# Patient Record
Sex: Male | Born: 1935 | Race: Black or African American | Hispanic: No | Marital: Married | Smoking: Never smoker
Health system: Southern US, Community
[De-identification: ages and names within clinical notes are randomized; demographics above are authoritative.]

## PROBLEM LIST (undated history)

## (undated) ENCOUNTER — Emergency Department (HOSPITAL_BASED_OUTPATIENT_CLINIC_OR_DEPARTMENT_OTHER): Admission: EM | Payer: Medicare Other | Source: Home / Self Care

## (undated) DIAGNOSIS — R55 Syncope and collapse: Secondary | ICD-10-CM

## (undated) DIAGNOSIS — R0609 Other forms of dyspnea: Secondary | ICD-10-CM

## (undated) DIAGNOSIS — M79603 Pain in arm, unspecified: Secondary | ICD-10-CM

## (undated) DIAGNOSIS — N3289 Other specified disorders of bladder: Secondary | ICD-10-CM

## (undated) DIAGNOSIS — I2510 Atherosclerotic heart disease of native coronary artery without angina pectoris: Secondary | ICD-10-CM

## (undated) DIAGNOSIS — K219 Gastro-esophageal reflux disease without esophagitis: Secondary | ICD-10-CM

## (undated) DIAGNOSIS — I10 Essential (primary) hypertension: Secondary | ICD-10-CM

## (undated) DIAGNOSIS — E7800 Pure hypercholesterolemia, unspecified: Secondary | ICD-10-CM

## (undated) DIAGNOSIS — I25810 Atherosclerosis of coronary artery bypass graft(s) without angina pectoris: Secondary | ICD-10-CM

## (undated) DIAGNOSIS — R109 Unspecified abdominal pain: Secondary | ICD-10-CM

## (undated) DIAGNOSIS — R079 Chest pain, unspecified: Secondary | ICD-10-CM

## (undated) DIAGNOSIS — E785 Hyperlipidemia, unspecified: Secondary | ICD-10-CM

## (undated) HISTORY — DX: Pain in arm, unspecified: M79.603

## (undated) HISTORY — DX: Other specified disorders of bladder: N32.89

## (undated) HISTORY — DX: Atherosclerotic heart disease of native coronary artery without angina pectoris: I25.10

## (undated) HISTORY — DX: Essential (primary) hypertension: I10

## (undated) HISTORY — DX: Atherosclerosis of coronary artery bypass graft(s) without angina pectoris: I25.810

## (undated) HISTORY — PX: CORONARY ARTERY BYPASS GRAFT: SHX141

## (undated) HISTORY — DX: Syncope and collapse: R55

## (undated) HISTORY — DX: Chest pain, unspecified: R07.9

## (undated) HISTORY — DX: Hyperlipidemia, unspecified: E78.5

## (undated) HISTORY — PX: OTHER SURGICAL HISTORY: SHX169

## (undated) HISTORY — DX: Other forms of dyspnea: R06.09

## (undated) HISTORY — DX: Unspecified abdominal pain: R10.9

---

## 2004-12-22 ENCOUNTER — Inpatient Hospital Stay (HOSPITAL_COMMUNITY): Admission: EM | Admit: 2004-12-22 | Discharge: 2004-12-24 | Payer: Self-pay | Admitting: Cardiology

## 2004-12-22 ENCOUNTER — Ambulatory Visit: Payer: Self-pay | Admitting: Cardiology

## 2008-06-11 ENCOUNTER — Encounter: Payer: Self-pay | Admitting: Emergency Medicine

## 2008-06-11 ENCOUNTER — Ambulatory Visit: Payer: Self-pay | Admitting: Cardiovascular Disease

## 2008-06-12 ENCOUNTER — Inpatient Hospital Stay (HOSPITAL_COMMUNITY): Admission: EM | Admit: 2008-06-12 | Discharge: 2008-06-22 | Payer: Self-pay | Admitting: Internal Medicine

## 2008-06-13 ENCOUNTER — Encounter: Payer: Self-pay | Admitting: Cardiothoracic Surgery

## 2008-06-13 ENCOUNTER — Ambulatory Visit: Payer: Self-pay | Admitting: Cardiothoracic Surgery

## 2008-06-14 ENCOUNTER — Encounter (INDEPENDENT_AMBULATORY_CARE_PROVIDER_SITE_OTHER): Payer: Self-pay | Admitting: Internal Medicine

## 2008-06-14 ENCOUNTER — Encounter: Payer: Self-pay | Admitting: Cardiothoracic Surgery

## 2008-07-03 ENCOUNTER — Ambulatory Visit: Payer: Self-pay | Admitting: Thoracic Surgery (Cardiothoracic Vascular Surgery)

## 2008-07-10 ENCOUNTER — Ambulatory Visit: Payer: Self-pay | Admitting: Cardiothoracic Surgery

## 2008-07-10 ENCOUNTER — Encounter: Admission: RE | Admit: 2008-07-10 | Discharge: 2008-07-10 | Payer: Self-pay | Admitting: Cardiothoracic Surgery

## 2008-07-19 ENCOUNTER — Encounter: Admission: RE | Admit: 2008-07-19 | Discharge: 2008-07-19 | Payer: Self-pay | Admitting: Cardiothoracic Surgery

## 2008-07-19 ENCOUNTER — Ambulatory Visit: Payer: Self-pay | Admitting: Cardiothoracic Surgery

## 2009-02-22 IMAGING — CR DG CHEST 2V
2 series · 2 of 2 positions shown · non-contrast
Comparison: 06/20/2008

CLINICAL DATA: Chest pain.  CABG.

CHEST - 2 VIEW

[view not recorded (1 of 2)]
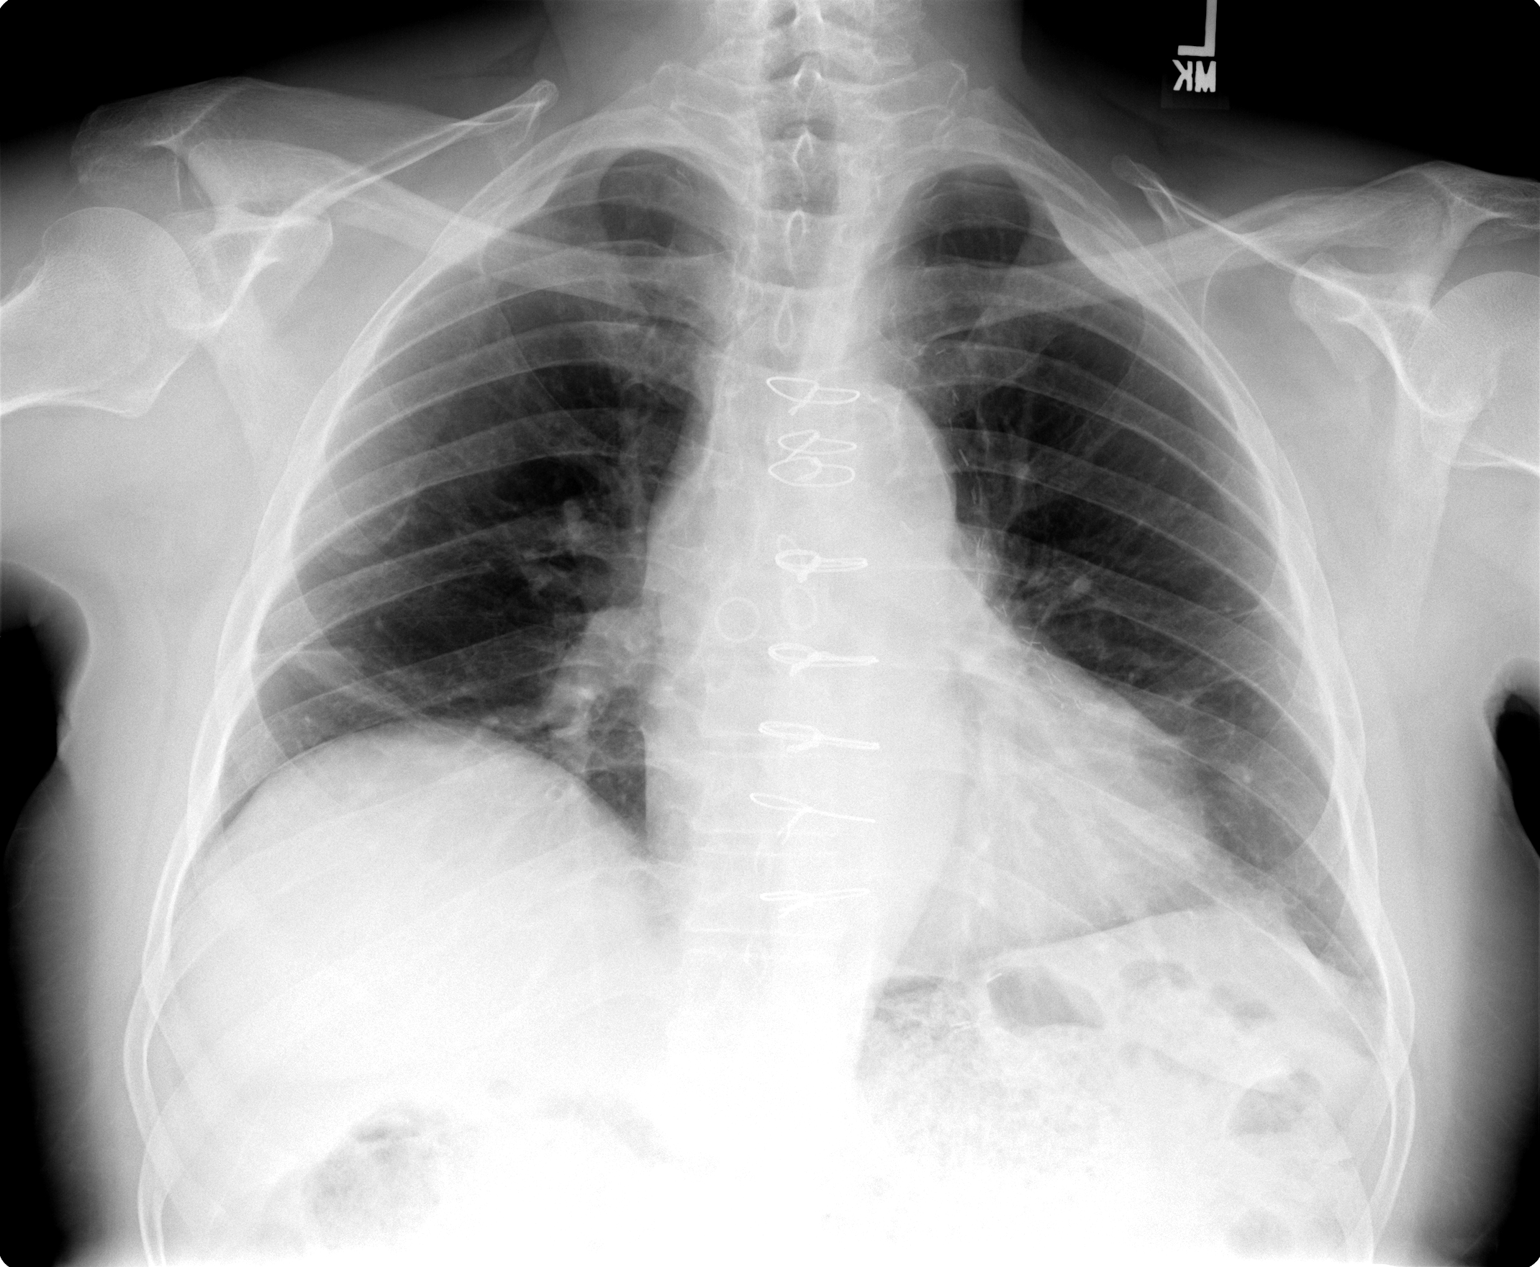

[view not recorded (2 of 2)]
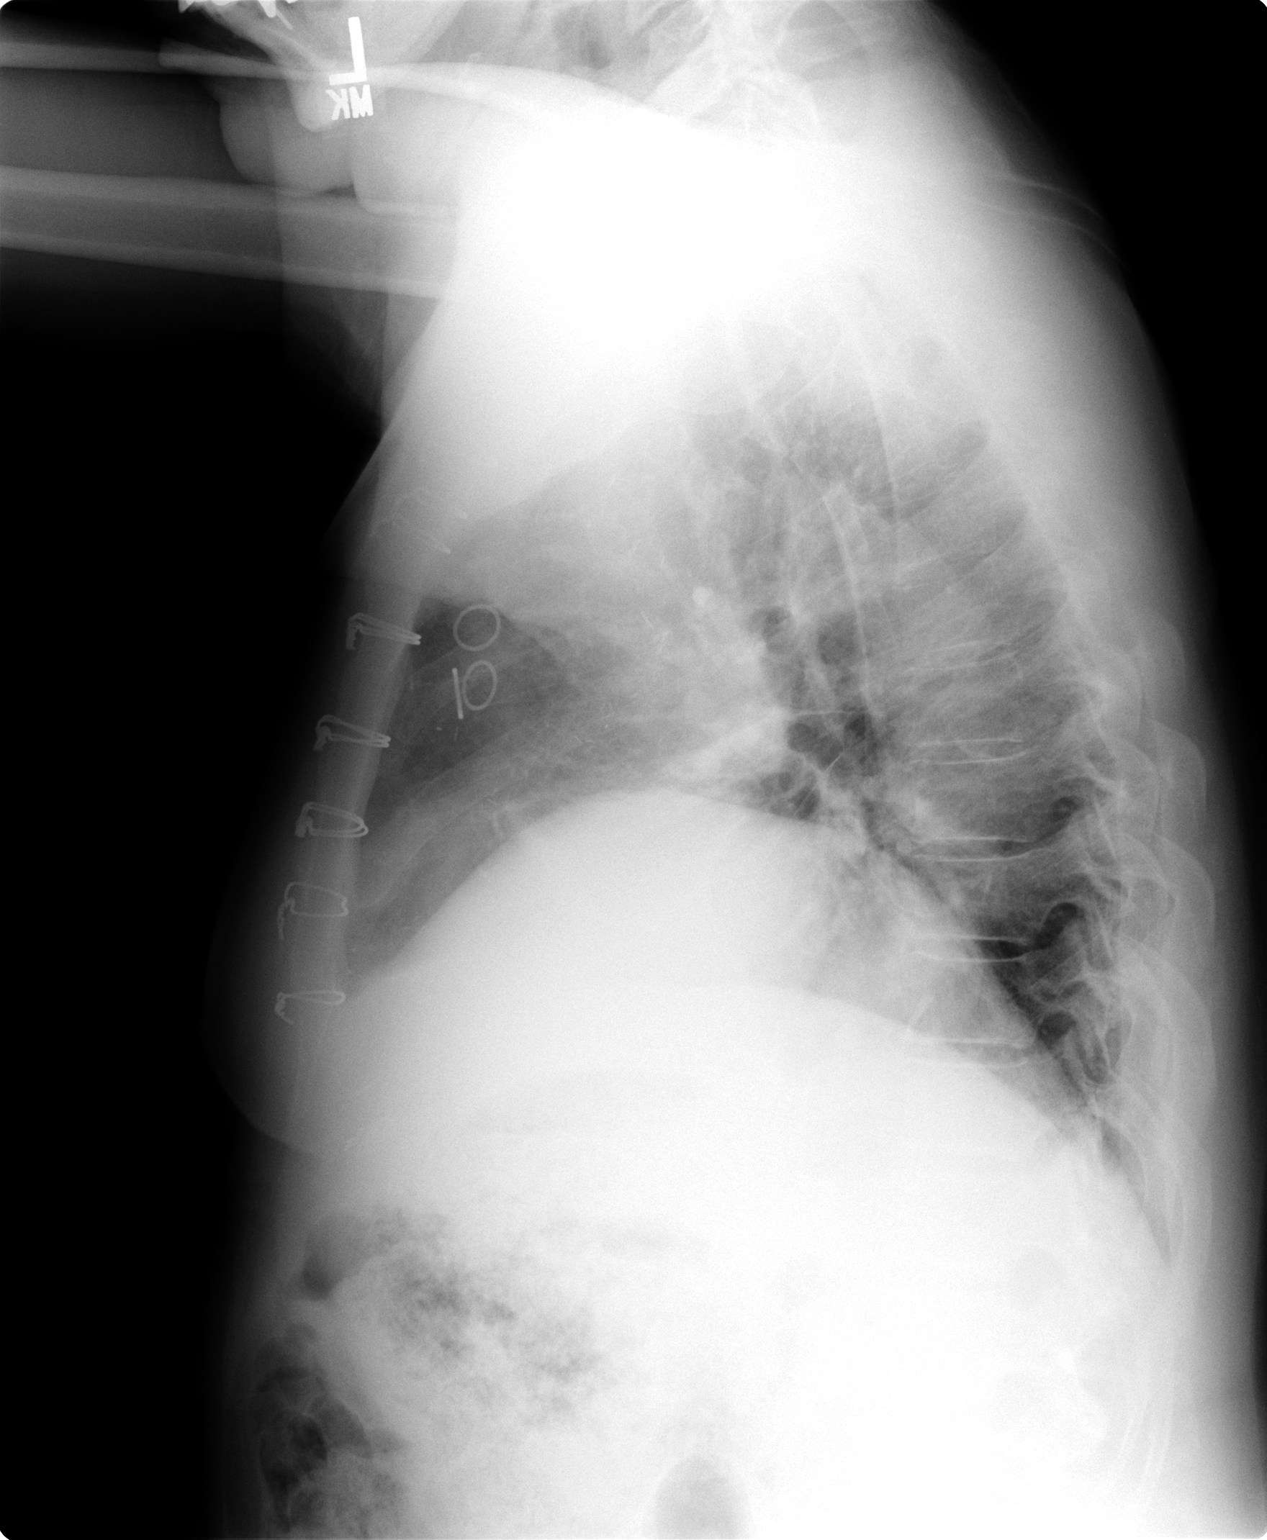

[2 of 2 positions shown; findings below may reference images not displayed]

FINDINGS: Subsegmental atelectasis at the bases, improved since
prior exam.  No airspace opacities.  Cardiomediastinal silhouette
appears unremarkable.  CABG changes..
IMPRESSION: Improvement in subsegmental atelectasis at the bases.

## 2010-10-20 ENCOUNTER — Emergency Department (HOSPITAL_BASED_OUTPATIENT_CLINIC_OR_DEPARTMENT_OTHER)
Admission: EM | Admit: 2010-10-20 | Discharge: 2010-10-20 | Payer: Self-pay | Source: Home / Self Care | Admitting: Emergency Medicine

## 2011-04-13 NOTE — Op Note (Signed)
NAME:  Sean Kelley, DADY NO.:  [id number]   MEDICAL RECORD NO.:  [id number]          PATIENT TYPE:  INP   LOCATION:  2308                         FACILITY:  [facility]   PHYSICIAN:  [name], M.D.  DATE OF BIRTH:  1930/02/08   DATE OF PROCEDURE:  06/17/2008  DATE OF DISCHARGE:                               OPERATIVE REPORT   OPERATION:  1. Coronary artery bypass grafting x5 (left internal mammary artery to      left anterior descending, saphenous vein graft to diagonal,      saphenous vein graft to posterior descending, sequential saphenous      vein graft to obtuse marginal 1 and obtuse marginal 2).  2. Endoscopic harvest of the right leg greater saphenous vein.   SURGEON:  [name], MD   ASSISTANT:  1. Evelene Croon, MD  2. Stephanie Acre. Dominick, PA-C   ANESTHESIA:  General.   POSTOPERATIVE DIAGNOSIS:  Class IV unstable angina with severe left main  and three-vessel coronary artery disease.   INDICATIONS:  The patient is a 77 year old gentleman who presented with  progressive chest pain, increasing in intensity and a with decreasing  threshold of onset with any activity.  He was ruled out for MI and was  cath'd by Dr. Katrinka Blazing, which showed a 70% left main stenosis and severe  critical three-vessel coronary artery disease and a left-dominant  circulation.  His LV function was preserved and he was felt to be a  candidate for surgical revascularization.  I examined the patient in his  hospital room and reviewed the results of the cardiac cath with the  patient and family.  I discussed indications and expected benefits of  coronary artery bypass surgery for treatment of his coronary artery  disease.  I reviewed the alternatives to surgical therapy as well.  I  discussed with him the major issues of surgery including the location of  the surgical incisions, the use of general anesthesia and  cardiopulmonary bypass, the expected postoperative hospital  recovery and  the potential risks of surgery including MI, CVA, bleeding, blood  transfusion requirement, infection, and death.  He understood these  implications for the surgery and agreed to proceed under what I felt was  an informed consent.   OPERATIVE FINDINGS:  The vein was harvested from the right leg and was  of good quality.  The mammary artery had excellent flow.  No blood  products were required for the surgery.  The posterior ascending branch  of the distal circumflex was a small 1-mm vessel, but graftable.  There  is no area of myocardial fibrosis or scarring.  The ascending aorta  measured 3.8 to 4.0-cm in diameter.  There is mild AI by TEE.   PROCEDURE:  The patient was brought to the operative room and placed  supine on the operating table.  General anesthesia was induced.  The  chest, abdomen, and legs were prepped with Betadine and draped as a  sterile field.  A sternal incision was made as the saphenous vein was  harvested endoscopically from the right leg.  The left internal mammary  artery was harvested as a pedicle graft from its origin at the  subclavian vessels.  It was a good vessel with excellent flow.  The  sternal retractor was placed and the pericardium was opened and  suspended.  Heparin was administered and the ACT was documented as being  therapeutic.  Pursestrings were placed in the ascending aorta in right  atrium and the patient was cannulated and placed on bypass.  The  coronaries were identified for grafting in the mammary artery and vein  grafts were prepared for the distal anastomoses.  Cardioplegia catheters  were placed for both antegrade and retrograde cold blood cardioplegia.  The patient was then cooled to 32 degrees and aortic crossclamp was  applied.  A 900 mL of cold blood cardioplegia was delivered in split  doses between the antegrade aortic and retrograde coronary sinus  catheters.  There was good cardioplegic arrest and septal  temperature  dropped less than 14 degrees.  Cardioplegia was then redosed every 20  minutes or less while the crossclamp was then placed.   The distal coronary anastomoses were performed.  The first distal  anastomosis was to the posterior branch of the distal circumflex, which  was the dominant vessel.  There was a 1.0-mm vessel and had a proximal  80% stenosis.  A reverse saphenous vein was sewn end-to-side with  running 7-0 Prolene and was good flow through the graft.  The second  distal anastomosis was to the diagonal branch to LAD.  This was a 1.5-mm  vessel with a proximal 80% ostial stenosis.  A reverse saphenous vein  was sewn end-to-side with running 7-0 Prolene with good flow through the  graft.  The third and fourth distal anastomoses consisted of a  sequential vein graft to the OM1 and the OM2.  The OM1 had a left main  proximal stenosis and was a 1.5-mm vessel.  A side-to-side anastomosis  with a saphenous vein was constructed using running 7-0 Prolene.  There  was a good flow through the graft.  The fourth distal anastomosis was  the continuation of the sequential vein graft to the OM2.  This had a 80-  90% stenosis in its proximal segment and the vein was sewn end-to-side  with running 7-0 Prolene with good flow through the graft.  Cardioplegia  was then redosed through the vein grafts.  The fifth distal anastomosis  was then placed to the mid-LAD.  There was a 1.7-mm vessel proximal left  main stenosis.  The left IMA pedicle was brought through an opening  created in the left lateral pericardium and was brought down onto the  LAD and sewn end-to-side with a running 8-0 Prolene.  A good flow  through the anastomosis after briefly releasing the pedicle bulldog on  the mammary artery.  The bulldog was reapplied and the pedicle was  secured to the epicardium.  Cardioplegia was then redosed.   While the crossclamp was still in place, three proximal vein anastomoses  were  placed on the ascending aorta using a 4.0-mm punch running 7-0  Prolene.  Prior to tying down the final proximal anastomosis, air was  vented from the coronaries with a dose of retrograde warm blood  cardioplegia in the usual de-airing maneuvers on bypass.  The final  proximal anastomosis was tied and the crossclamp was removed.   The heart was cardioverted back to a regular rhythm.  Air was aspirated  from the vein grafts with a  27-gauge needle.  The proximal distal  anastomoses were checked and found to be hemostatic.  Each graft had  good flow.  The patient was rewarmed to 37 degrees and temporary pacing  wires were applied.  When the patient was rewarmed and reperfused, the  lungs re-expanded, then the ventilator was resumed.  The patient was  then weaned from bypass without difficulty, without requiring inotropes.  Cardiac output and blood pressures were within normal limits and  protamine was administered without adverse reaction.  The cannulas were  all removed and mediastinum was irrigated with warm antibiotic  irrigation.  The leg incision was irrigated and closed in a standard  fashion.  The superior pericardial valve was closed over the aorta and  vein grafts.  Two mediastinal and a left pleural chest tube were placed  and brought out through separate incisions.  The sternum was closed with  interrupted steel wire.  The pectoralis fascia was closed with a running  #1 Vicryl.  The subcutaneous and skin layers were closed in running  Vicryl and sterile dressings were applied.  Total bypass time was 132  minutes with crossclamp time of 98 minutes.      [name], M.D.  Electronically Signed    PV/MEDQ  D:  06/17/2008  T:  06/18/2008  Job:  [id number]   cc:   Lyn Records, M.D.

## 2011-04-13 NOTE — Assessment & Plan Note (Signed)
OFFICE VISIT   Sean Kelley, Sean Kelley  DOB:  1930-07-07                                        July 19, 2008  CHART #:  [id number]   HISTORY OF PRESENT ILLNESS:  This is a 77 year old African American  male, who is status post coronary artery bypass grafting x5 on June 17, 2008, by Dr. Donata Kelley.  The patient had previously been seen in the  office regarding low grade (high 99 to a fever of 100.3) after having  been discharged from the hospital.  He also had noticed some redness and  swelling along the endovascular vein harvesting site of the right lower  extremity.  Dr. Dorris Kelley had seen him in the office on July 03, 2008, and placed him on Keflex.  However, approximately 2 days after  taking Keflex, the patient developed an urticarial rash.  The Keflex was  stopped and Avelox was started.  He was last seen in the office on  July 10, 2008.  At that time, he had no further fever, chills, sweats,  and he reported that the swelling had decreased since the last office  visit.  Currently on this office visit, the patient states he has  finished his Avelox.  He denies any fevers or chills.  He also states  that the swelling and redness has decreased since his last office visit.  The patient's only complaint is occasional shortness of breath upon  exertion.   PHYSICAL EXAMINATION:  GENERAL:  This is a pleasant 77 year old [place]  American male, who is in no acute distress.  VITAL SIGNS:  BP is 134/79, pulse rate 87, respirations 18, O2 sat 98%  on room air.  CARDIOVASCULAR:  Regular rate and rhythm.  S1 and S2 without murmurs,  gallops, or rubs.  PULMONARY:  Clear to auscultation bilaterally, slightly decreased at  left base.  EXTREMITIES:  Trace edema at the right lower extremity.  EVH site at the  right lower extremity, minimal swelling.  EVH site at left lower  extremity is clean, dry, no swelling or erythema.  STERNUM:  Solid.  No crepitus noted.   Wound is clean and dry.   Chest x-ray today showed some atelectasis at right lower lobe.  No  pleural effusions.  No pneumothorax.   IMPRESSION AND PLAN:  The patient will see Dr. Donata Kelley on a p.r.n.  basis.  He will continue to be followed by Dr. Katrinka Kelley.  It should be  noted that the patient was recently taken off metoprolol.  He still  continues to take simvastatin, ECASA, however.  The patient will call  for any further problems, questions, or concerns in the interim.   Sean Kelley, M.D.  Electronically Signed   DZ/MEDQ  D:  07/19/2008  T:  07/19/2008  Job:  [id number]   cc:   Sean Kelley, M.D.

## 2011-04-13 NOTE — Discharge Summary (Signed)
NAME:  Sean Kelley, Sean Kelley NO.:  [id number]   MEDICAL RECORD NO.:  [id number]          PATIENT TYPE:  INP   LOCATION:  2019                         FACILITY:  [facility]   PHYSICIAN:  [name], M.D.  DATE OF BIRTH:  1930/09/21   DATE OF ADMISSION:  06/11/2008  DATE OF DISCHARGE:  06/22/2008                               DISCHARGE SUMMARY   FINAL DIAGNOSIS:  Class IV unstable angina with severe left main and 3-  vessel coronary artery disease.   IN-HOSPITAL DIAGNOSES:  1. Volume overload postoperatively.  2. Acute blood loss anemia postoperatively.  3. Borderline diabetes mellitus.   SECONDARY DIAGNOSES:  1. Hypertension.  2. History of peptic ulcer disease status post vagotomy and antrectomy      at [facility] over 30 years ago.  3. Dyslipidemia.   IN-HOSPITAL OPERATIONS AND PROCEDURES:  1. Cardiac catheterization.  2. Coronary artery bypass grafting x5 using a left internal mammary      artery to left anterior descending, saphenous vein graft to      diagonal, saphenous vein graft to posterior descending, and      sequential saphenous vein graft to obtuse marginal 1 and 2.      Endoscopic saphenous vein harvesting from right leg.   HISTORY AND PHYSICAL AND HOSPITAL COURSE:  The patient is a 77 year old  gentleman who presented with progressive chest pain, increasing in  intensity with a decreasing threshold of onset with any activity.  He is  ruled out for an MI and underwent cardiac catheterization by Dr. Katrinka Blazing.  This shows severe left main and 3-vessel coronary artery disease.  Dr.  Donata Clay was consulted.  Dr. Donata Clay saw and evaluated the patient.  He discussed with the patient regarding coronary artery bypass grafting.  He discussed the risks and benefits with the patient.  The patient  acknowledged this understanding and agreed to proceed.  Surgery was  scheduled for June 17, 2008.  Preoperatively, the patient underwent  bilateral carotid duplex  ultrasound showing no evidence of significant  ICA stenosis.  He also had preoperative ABIs done showing on the right  to be greater than 0.95 and on the left to be 0.98.  The patient  remained stable preoperatively.  For details of the patient's past  medical history and physical exam, please see dictated H&P.   The patient was taken to the operating room on June 17, 2008, where he  underwent coronary artery bypass grafting x5 using a left internal  mammary artery to left anterior descending, saphenous vein graft to  diagonal, saphenous vein graft to posterior descending, and sequential  saphenous vein graft to obtuse marginal 1 and 2.  The patient had  endoscopic vein harvesting from the right leg done.  The patient  tolerated this procedure well and transferred to the intensive care unit  in stable condition.  Postoperatively, the patient was known to be  hemodynamically stable.  He was extubated evening of surgery.  Postextubation, he was noted to be alert and oriented x4 and neuro  intact.  The patient's postoperative course is  premature and remarkable.  All lines and drips were discontinued in normal fashion.  Postoperative  chest x-ray was stable and he had minimum change from chest tubes and  these were able to be discontinued in the normal fashion.  The patient  did have some mild volume overload and was started on diuretics.  He  also had some mild blood loss anemia with hemoglobin 8.4 and started on  p.o. iron.  No transfusions were required.  The patient transferred out  to 2000 on postop day #2.  The patient continued to progress well.  From  a pulmonary standpoint, he was using his incentive spirometer.  He was  able to be weaned off oxygen sating greater than 90% on room air.  The  patient remained in normal sinus rhythm postoperatively.  He was able to  be started on low-dose beta-blocker.  Heart rate and blood pressure  tolerated well.  His hemoglobin and hematocrit were  monitored and  remained stable.  He was continued on p.o. iron.  The patient was out of  bed, ambulating well with cardiac rehab.  He was tolerating diet well  and no nausea and vomiting noted.  All incisions were clean, dry, and  intact and healing well.  The patient had a hemoglobin A1c done  preoperatively showing to be slightly elevated at 6.5.  The patient has  no history of diabetes.  He had been started on Lantus and sliding scale  insulin.  The plan is for this to be discontinued prior to discharge.   On postop day #4, the patient was known to be afebrile and normal sinus  rhythm in the 80s.  O2 sats 94% on room air.  He still remains 4.7 kg  above his preoperative weight.  Blood sugars were stable.  CBC done  showed a white count 6.2, hemoglobin is 8.4, hematocrit  24.5, platelet  count of 151.  BUN and creatinine day prior was 14 and 0.87.   The patient was tentatively ready for discharge home in the a.m. postop  day #5, June 22, 2008.   FOLLOWUP APPOINTMENTS:  A followup appointment has been arranged with  Dr. Donata Clay for July 19, 2008, at 12:30 p.m.  The patient will need  to obtain PA and lateral chest x-ray 30 minutes prior to this  appointment.  He will need to contact Dr. Michaelle Copas office to make a  followup appointment with him in 2 weeks.   ACTIVITY:  The patient was instructed no driving until he agrees to do  so.  No heavy lifting over 10 pounds.  He was told to ambulate 3-4 times  per day, progress as tolerated, and to continue his breathing exercises.   INCISIONAL CARE:  The patient was told to shower and washing his  incisions using soap and water.  He is to contact the office if he  develops any drainage or opening from any of his incision sites.   DIET:  The patient was educated on diet, to be on low-fat, low-salt as  well as carbohydrate modify medium calorie diet.   DISCHARGE MEDICATIONS:  1. Aspirin 81 mg daily.  2. Lopressor 25 mg b.i.d.  3. Zocor  20 mg at night.  4. Lasix 40 mg daily x5 days.  5. Potassium chloride 20  mEq daily x5 days.  6. Niferex 150 mg daily.  7. Oxycodone 5 mg 1-2 tablets every 4-6 hours p.r.n.      Theda Belfast, PA  [name], M.D.  Electronically Signed    KMD/MEDQ  D:  06/21/2008  T:  06/22/2008  Job:  [id number]   cc:   [name], M.D.  Lyn Records, M.D.

## 2011-04-13 NOTE — Consult Note (Signed)
NAME:  Sean Kelley, Sean Kelley NO.:  [id number]   MEDICAL RECORD NO.:  [id number]          PATIENT TYPE:  INP   LOCATION:  3709                         FACILITY:  [facility]   PHYSICIAN:  [name], M.D.  DATE OF BIRTH:  10/23/1930   DATE OF CONSULTATION:  06/13/2008  DATE OF DISCHARGE:                                 CONSULTATION   REQUESTING PHYSICIAN:  Lyn Records, M.D.   PRIMARY CARE PHYSICIAN:  Fleet Contras, MD   CONSULTANT:  [name], MD   REASON FOR CONSULTATION:  Left main and severe three-vessel coronary  artery disease.   CHIEF COMPLAINT:  Chest pain with class III progressive angina.   HISTORY OF PRESENT ILLNESS:  I was asked to evaluate this 77 year old  African American male who was admitted to the hospital following cardiac  catheterization for symptoms of exertional class III angina.  A previous  cath performed 2-3 years ago showed nonsignificant coronary disease.  He  was admitted, where his cardiac enzymes were checked and were negative.  Diagnostic cath today by Dr. Katrinka Blazing demonstrated a 50-60% tubular  stenosis of the left main.  By IVUS, there is a 70% reduction in the  luminal area.  The first diagonal had a 70% stenosis, the OM3 had a 90%  stenosis and the patient had a left dominant coronary circulation.  LV  EF was normal and there was no evidence of valvular insufficiency.  Based on the patients' coronary anatomy and symptoms, he is felt to be a  candidate for surgical coronary revascularization.  He is currently  stable on subcutaneous Lovenox therapeutic dosing.   PAST MEDICAL HISTORY:  1. Hypertension.  2. History of peptic ulcer disease status post vagotomy and antrectomy      at [facility] over 30 years ago.  3. No known drug allergies.  4. Dyslipidemia.  5. Left-hand dominance.   HOME MEDICATIONS:  None.   ALLERGIES:  None.   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   He does not smoke or use alcohol.Marland Kitchen   FAMILY HISTORY:  His parents died of heart disease.  No one in the  family has had heart bypass surgery.  The patient is married and lives  in [place].   REVIEW OF SYSTEMS:  GENERAL:  Negative for fever and weight loss.  ENT:  Negative for active dental problems.  He does have an upper bridge.  Denies difficulty swallowing.  THORACIC:  Negative for history of chest  trauma.  A chest x-ray taken 48 hours ago shows no significant  cardiopulmonary disease.  CARDIAC:  Positive for his severe coronary  disease, left main stenosis and unstable angina as noted above.  ABDOMEN:  Positive for his previous laparotomy and ulcer disease but  negative for hepatitis, jaundice, or blood per rectum recently.  UROLOGIC:  Negative for prostate cancer, BPH, or kidney stones or UTI.  ENDOCRINE:  Negative for diabetes or thyroid disease.  VASCULAR:  Negative for DVT, claudication, or TIA.  NEUROLOGIC:  Negative for  stroke or seizure.   PHYSICAL EXAMINATION:  VITALS SIGNS:  The patient is 5 feet 8 inches and  weighs 170 pounds, blood pressure 145/88, pulse 60, respirations 18.  GENERAL:  Pleasant, well-built, elderly black male in no acute distress,  surrounded by family.  HEENT:  Normocephalic.  Dentition good.  EOMs are full.  NECK:  Without JVD, mass, or carotid bruit.  LYMPHATICS:  No palpable supraclavicular or cervical adenopathy.  Breath  sounds are clear and there is no thoracic deformity.  CARDIAC:  Regular  rhythm with a soft 1/6 systolic murmur at the left lower sternal border.  There is no friction rub.  ABDOMEN:  Soft without pulsatile mass.  Bowel sounds are present.  EXTREMITIES:  No clubbing, cyanosis, or edema.  VASCULAR:  Pulses 1-2+ in all extremities.  SKIN:  Without rash or lesion.  NEUROLOGIC:  Alert and oriented x3 without focal motor deficit.   LABORATORY DATA:  I reviewed his coronary arteriograms and coronary CT  scans.  He has a  significant left main stenosis with left-dominant  circulation and high-grade stenosis of the OM3 branch to the circumflex.  His vessels are adequate targets for bypass surgery.   RECOMMENDATIONS:  The patient would be best treated with multivessel  bypass surgery for his severe left main multivessel disease.  We will  schedule his surgery as soon as possible and we recommend keeping the  patient in the hospital on heparin until surgery.  His preoperative  studies to follow include a 2-D echocardiogram and carotid Dopplers and  peripheral vascular studies (noninvasive).  Thank you for the  consultation.      [name], M.D.  Electronically Signed     PV/MEDQ  D:  06/13/2008  T:  06/14/2008  Job:  [id number]

## 2011-04-13 NOTE — H&P (Signed)
NAME:  Sean Kelley, Sean Kelley NO.:  [id number]   MEDICAL RECORD NO.:  [id number]          PATIENT TYPE:  INP   LOCATION:  3709                         FACILITY:  [facility]   PHYSICIAN:  Hollice Espy, M.D.DATE OF BIRTH:  January 22, 1930   DATE OF ADMISSION:  06/11/2008  DATE OF DISCHARGE:                              HISTORY & PHYSICAL   ATTENDING PHYSICIAN:  The patient has no PCP, but is scheduled to see a  Dr. Selinda Eon in a few months.   CARDIOLOGIST:  Lyn Records, M.D.   CHIEF COMPLAINT:  Chest pain.   HISTORY OF PRESENT ILLNESS:  The patient is a 77 year old African  American male with no past medical history, who a month ago started  having episodes of heavy pressure.  He previously had seen Dr. Katrinka Blazing and  had a cardiac cath done several years back.  At that time he was told he  had 1 vessel that was diseased, but there was no need for intervention  at that time.  Again, he has been having episodes of chest pressure  about a month ago.  It resolved, so we could not follow up on it.  He  since that time has had a couple of episodes, but nothing as severe as  the first time.  This is located over his left breast.  Then 1 day prior  to coming in, he started having episodes of a separate type of pain.  This is more associated with a sharp pins and needles and feeling, more  over the right breast, but also had felt very lightheaded.  ain in the  needle feeling more over the right breast but also had felt very  lightheaded.  He initially tried to dismiss it, but after his family  found out today, they spoke to Dr. Michaelle Copas office who advised the  patient to go to the emergency room.  The patient went to [facility], where after evaluation of a negative EKG and initial  set of enzymes, it was felt best to come in for further evaluation and  possible treatment.  The patient was arranged as a direct admission to  [facility]  Initially [facility]  hospitalists were consulted  because the patient mentioned [facility] cardiology, but he actually was  found to have no PCP.  After the patient was accepted, I have spoken  with the [facility] hospitalist and will admit the patient and the  patient will be picked up by [facility], Team B following.  Currently the  patient is doing well.  He currently denies any headaches, vision  changes, dysphagia, chest pain, palpitations, shortness of breath,  wheeze, cough, abdominal pain, hematuria, dysuria, constipation,  diarrhea, focal extremity numbness, weakness or pain.   REVIEW OF SYSTEMS:  Otherwise negative.   PAST MEDICAL HISTORY:  None.  His prestated cardiac cath was found to  have 1 vessel of disease.  This was done in January 2006.  Specifically  he is found to have a nonobstructive CAD with distal left main plaque  extending into ostial left anterior descending,  not flow limiting   MEDICATIONS:  None.   ALLERGIES:  None.   SOCIAL HISTORY:  Denied tobacco, alcohol or drug use.   FAMILY HISTORY:  Noted for dad, who died of an MI at about the same age  as the patient.   PHYSICAL EXAMINATION:  The patient's vitals on admission to the floor,  temperature 97.8, heart rate 62, blood pressure 154/82, respirations 18,  O2 sat 98% on room air.  GENERAL:  He is alert and oriented x3, in no  apparent distress.  HEENT:  Normocephalic atraumatic.  Mucous membranes are moist.  He has  no carotid bruits.  HEART:  Regular rate and rhythm.  S1 and S2.  2/6 systolic ejection  murmur.  LUNGS:  Clear to auscultation bilaterally.  ABDOMEN:  Soft, nontender, nondistended.  Positive bowel sounds.  EXTREMITIES:  Showed no clubbing, cyanosis or edema.   LAB WORK:  CPK 56.1, MB 1.22, troponin 0.05.  Sodium 139, potassium 4.2,  chloride 100, bicarb 29, BUN 14, creatinine 0.9, glucose 98.  White  count 0.1, H and H 14.6 and 42, MCV of 97, platelet count 191, 34%  neutrophils.   ASSESSMENT AND PLAN:  1.  Atypical chest pain pressure 1 month ago, now more of a sharp pins      and needles sensation in a different location.  Check a D-dimer.      Enzymes x3.  First set is already negative.  [facility] to      see in the morning.  Will check also check a fasting lipid profile.  2. Elevated blood pressure here.  The patient has some previous      history of reported blood pressure.  However, he will be following      with the primary care physician to establish in several months, who      can follow him, and if needed, start him on medication.      Hollice Espy, M.D.  Electronically Signed     SKK/MEDQ  D:  06/11/2008  T:  06/11/2008  Job:  [id number]   cc:   Dr. Signe Colt, M.D.

## 2011-04-13 NOTE — Assessment & Plan Note (Signed)
OFFICE VISIT   Sean Kelley, Sean Kelley  DOB:  10/13/1930                                        July 03, 2008  CHART #:  [id number]   The patient is a 78 year old gentleman who had coronary bypass grafting  x5 on June 17, 2008, by Dr. Donata Clay.  Over the past few days, he has  noted low-grade fevers usually in the high 99s, but yesterday when  checked, it was 100.3, he was taking Tylenol.  Also, noted some redness  and swelling along his saphenous venectomy harvest tract in the right  leg.  He has had some minimal pain in his sternal incision, but no  redness there and has not had to take any pain medication in the past 5  or 6 days.   PHYSICAL EXAMINATION:  GENERAL:  The patient is a 77 year old gentleman,  in no acute distress.  VITAL SIGNS:  Blood pressure 144/76, pulse 85, respirations are 18, and  his oxygen saturation is 96% on room air.  CHEST:  Sternal incisions are clean, dry, and intact.  Sternum is  stable.  EXTREMITIES:  His leg incisions are healing well.  He does have fluid  along the saphenous venectomy tract about mid thigh to mid calf.  There  is also some mild erythema, mild warmth, and mild tenderness.   IMPRESSION:  The patient is a 77 year old gentleman status post coronary  artery bypass grafting about 2 weeks ago.  He now has some fluid more  likely a seroma along the saphenous venectomy tract, but he does have  some overlying early cellulitis with some low-grade temperatures.  I am  now starting him on Keflex 500 mg p.o. 3 times daily for a week and  advised him to take all of those capsules and complete the course even  if the warmth and tenderness  improves.  We will plan to have him see Dr. Donata Clay back in the office  next Friday, which will be July 12, 2008.  We will do a chest x-ray at  that time.   Salvatore Decent Dorris Fetch, M.D.  Electronically Signed   SCH/MEDQ  D:  07/03/2008  T:  07/03/2008  Job:  [id number]

## 2011-04-13 NOTE — Assessment & Plan Note (Signed)
OFFICE VISIT   TIRAS, BIANCHINI  DOB:  09/19/1930                                        July 19, 2008  CHART #:  [id number]   ADDENDUM   PHYSICAL EXAMINATION:  EXTREMITIES:  The patient had endoscopic vein  harvest of the right lower extremity only.  There was no EVH site at the  left lower extremity.  Therefore, there was no wound.   [name], M.D.  Electronically Signed   DZ/MEDQ  D:  07/19/2008  T:  07/20/2008  Job:  [id number]

## 2011-04-13 NOTE — Consult Note (Signed)
NAME:  Sean Kelley, Sean Kelley NO.:  [id number]   MEDICAL RECORD NO.:  [id number]          PATIENT TYPE:  INP   LOCATION:  3709                         FACILITY:  [facility]   PHYSICIAN:  Lyn Records, M.D.   DATE OF BIRTH:  1930-02-26   DATE OF CONSULTATION:  DATE OF DISCHARGE:                                 CONSULTATION   REASON FOR CONSULTATION:  Chest discomfort.   CONCLUSIONS:  1. Atypical chest discomfort of two types.  The patient has had a      fluttering or pounding type discomfort that occurred yesterday      predominantly on the right chest.  2. One-month history of intermittent tightness in the chest.  3. History of prior cardiac catheterization with less than 30%      narrowing of the distal left main.  4. Hyperlipidemia.   RECOMMENDATIONS:  Coronary CT angiography, to rule out significant  obstructive disease.  This study is being done in lieu of the stress  test as we were not notified of the patient's presence until early this  afternoon and it was not possible to do a stress test on the patient  until the a.m.  If we do the CTA and the patient's coronary is widely  patent, he would likely be discharged from the hospital this evening.   COMMENTS:  The patient is 76 years of age.  He has undergone previous  cardiac workup including catheterization in 2006 by Dr. Diona Browner.  He  was found to have less than 30% narrowing in the distal left main with  widely patent coronaries otherwise.  Over the past month, he has had  some recurring episodes of discomfort that he described as a heaviness  in his chest.  These episodes tend to occur spontaneously.  Yesterday,  he began having some fluttering in his right chest.  He had been  concerned about this and also noted that he will break out into a sweat  when this would occur.  The patient was seen at the ED in [facility].  From there, he was transferred to [facility].  He states today  that his chest is  felt generally heavy, but he has not told anyone about  it.  He otherwise feels fine.   MEDICATIONS ON ADMISSION:  None.   ALLERGIES:  None.   HABITS:  Does not smoke and does not drink.   FAMILY HISTORY:  His mother and father died of myocardial infarction in  their 67s.   SIGNIFICANT MEDICAL PROBLEMS:  1. Hyperlipidemia.  2. Gastroesophageal reflux.   PHYSICAL EXAMINATION:  GENERAL:  The patient is in no acute distress.  VITAL SIGNS:  Blood pressure 124/70, heart rate 60.  NECK:  Neck veins not distended.  No carotid bruits.  LUNGS:  Clear.  CARDIAC:  1/6 systolic murmur.  No diastolic murmur.  ABDOMEN:  Soft.  EXTREMITIES:  No edema.  Pulses 2+ and symmetric in upper and lower  extremities.   Laboratory data reveals creatinine of 0.9, BUN 14.  CK-MB and troponin  negative x2.  LDL  cholesterol 118.  EKG:  Normal sinus rhythm and  basically a normal tracing with slight right axis deviation.   DISCUSSION:  The patient's symptoms are multiform.  The acute discomfort  that prompted hospitalization is probably an arrhythmia or some form of  noncardiac chest discomfort.  I am somewhat concerned about recurring  chest tightness that has been happening for the past month.  He did have  minimal obstructive disease in the left main 3 years ago.  Coronary CTA  will be done to ensure patency of the left main and that there is no  significant proximal obstructive disease since his coronaries were  normal otherwise when evaluated 3 years ago.      Lyn Records, M.D.  Electronically Signed     HWS/MEDQ  D:  06/12/2008  T:  06/13/2008  Job:  [id number]   cc:   Dr. Marin Olp

## 2011-04-13 NOTE — Assessment & Plan Note (Signed)
OFFICE VISIT   DELVECCHIO, MADOLE  DOB:  Aug 31, 1930                                        July 10, 2008  CHART #:  [id number]   HISTORY:  The patient is a 77 year old gentleman who underwent coronary  artery bypass grafting x5 on June 17, 2008, by Dr. Donata Clay.  He was  seen on July 03, 2008, by Dr. Charlett Lango when he presented with  a low-grade temperature as well as redness and swelling along his  saphenous venectomy harvest tract in the right leg.  The patient was  felt to have early cellulitis and was placed on Keflex.  Approximately 2  days after he was placed on Keflex, he developed an urticarial rash.  He  was subsequently changed to oral Avelox.  Currently, he reports no  fevers, chills, sweats, or other constitutional symptoms.  He reports  that the pain in the right leg has improved as has the swelling.  He  otherwise reports that he is doing well.   CHEST X-RAY:  A chest x-ray was obtained on today's date.  It reveals  clear lung fields with no evidence of effusions or congestive failure.   PHYSICAL EXAMINATION:  VITAL SIGNS:  Blood pressure is 125/76, pulse is  82, respirations 18, and oxygen saturation is 98% on room air.  GENERAL:  A 77 year old gentleman in no acute distress.  PULMONARY:  Clear lungs throughout.  CARDIAC:  Regular rate and rhythm.  Incisions the right lower leg, EVH  saphenectomy site reveals no drainage.  There is no erythema.  There is  mild swelling.  The sternal incision is healing well without evidence of  infection.  EXTREMITIES:  Reveal trace edema.   IMPRESSION:  The patient is doing well overall.  He has had a good  response to his oral antibiotics with the notation of the urticarial  rash secondary to Keflex.  I have encouraged him to  continue taking his Avelox until the prescription has been completed.  He is scheduled to see Dr. Donata Clay in the office next week and we will  keep that  appointment.   Rowe Clack, P.A.-C.   Sherryll Burger  D:  07/10/2008  T:  07/11/2008  Job:  [id number]

## 2011-04-16 NOTE — Cardiovascular Report (Signed)
NAME:  Sean Kelley, Sean Kelley NO.:  [id number]   MEDICAL RECORD NO.:  [id number]          PATIENT TYPE:  INP   LOCATION:  3741                         FACILITY:  [facility]   PHYSICIAN:  Rollene Rotunda, M.D.   DATE OF BIRTH:  January 18, 1930   DATE OF PROCEDURE:  12/23/2004  DATE OF DISCHARGE:                              CARDIAC CATHETERIZATION   PRIMARY CARE PHYSICIAN:  Dr. Dorise Hiss.   CARDIOLOGIST:  Dr. Jonelle Sidle, [facility] [place].   PROCEDURAL NOTE:  Left-heart catheterization was performed via the right  femoral artery.  The artery was cannulated using anterior wall puncture.  A  #6 French arterial sheath was inserted via the modified Seldinger technique.  Preformed Judkins and a pigtail catheter were utilized.  Of note, I needed a  right bypass graft catheter to cannulate the anterior takeoff of the  codominant right coronary.  The patient tolerated the procedure well and  left the lab in stable condition.   RESULTS:   HEMODYNAMICS:  LV 167/20, AO 162/75.   CORONARIES:  The left main had distal 30% stenosis.  The LAD had ostial 30%  stenosis and wrapped at the apex.  There was a mid-diagonal which was large  and normal.  The circumflex and the AV grove was normal.  Ramus intermediate  was large, with luminal irregularities.  Mid obtuse marginal was large and  branching, and normal.  Posterolateral 1 and posterolateral 2 were moderate  size and normal.  There was a PDA which was somewhat large and normal.  The  right coronary artery was codominant.  There was a small PDA which was  normal.  The remainder of the vessel was normal throughout its length.   LEFT VENTRICULOGRAM:  A left ventriculogram was obtained in the RAO  projection.  EF was 65% with normal-wall motion.   CONCLUSION:  Nonobstructive coronary artery disease with the distal left  main plaque extending into the ostial left anterior descending (LAD).  However, this is clearly not flow  limiting.   PLAN:  No further cardiovascular testing is suggested.  The patient should  have very aggressive secondary workup.  Dr. Anne Hahn was not notified because  of the late nature of this case.      JH/MEDQ  D:  12/23/2004  T:  12/23/2004  Job:  [id number]   cc:   Dorise Hiss, M.D.  [address]  [place]  [place] [address]  Fax: [phone]   Jonelle Sidle, M.D. [facility]

## 2011-04-16 NOTE — Discharge Summary (Signed)
NAME:  Sean Kelley, Sean Kelley NO.:  [id number]   MEDICAL RECORD NO.:  [id number]          PATIENT TYPE:  INP   LOCATION:  3741                         FACILITY:  [facility]   PHYSICIAN:  Gene Serpe, P.A. [facility]   DATE OF BIRTH:  1930/07/08   DATE OF ADMISSION:  12/22/2004  DATE OF DISCHARGE:  12/24/2004                           DISCHARGE SUMMARY - REFERRING   PROCEDURE:  Cardiac catheterization, December 23, 2004.   REASON FOR ADMISSION:  [name] is a 77 year old male, with no prior  history of coronary artery disease, with history of hypertension, who  initially presented to [facility] with complaint of chest pain.  He  was referred to Dr. Nona Dell for further evaluation.  Please refer to  dictated consultation note for full details.   LABORATORY DATA:  CBC normal.  Potassium 3.7, BUN 13, creatinine 0.9,  glucose 106.   Admission chest x-ray:  Bibasilar atelectasis.   HOSPITAL COURSE:  Following transfer from [facility], where the  patient presented with atypical chest pain in the context of several cardiac  risk factors and ruled out for myocardial infarction with negative serial  troponins, recommendation was to proceed with diagnostic cardiac  catheterization.   Procedure was performed on the following day, by Dr. Rollene Rotunda (see  report for full details), revealing nonobstructive coronary artery disease,  with a normal left ventricle.   No further cardiac workup was recommended, and the patient was kept for  overnight observation and cleared for discharge the following morning in  hemodynamically stable condition.  There were no noted complications of the  groin incision site at discharge.   The patient will be discharged on low-dose aspirin.  Regarding his history  of hypertension, however, he states that he is unaware of any previous  diagnosis of this and was not on any medications beforehand.  He also  preferred to follow up with  his primary care physician, Dr. Anne Hahn, for  further evaluation of hypertension and subsequent discussion about an  appropriate antihypertensive medication, if needed.   MEDICATIONS AT DISCHARGE:  Coated aspirin 81 mg daily.   INSTRUCTIONS:  1.  No heavy lifting/driving x 2 days.  2.  Maintain lowfat/low cholesterol diet.  3.  Call the [facility] office [phone]) if there is any swelling/bleeding in the      groin.  4.  Patient instructed to arrange followup with his primary care physician,      Dr. Lucianne Lei, in the ensuing 1-1/2 to 2 weeks.   DISCHARGE DIAGNOSES:  1.  Noncardiac chest pain.      1.  Negative serial cardiac markers.      2.  Nonobstructive coronary artery disease - cardiac catheterization,          December 23, 2004.  2.  History of hypertension.      GS/MEDQ  D:  12/24/2004  T:  12/24/2004  Job:  [id number]   cc:   Dorise Hiss, M.D.  [address]  [place]  [place] [address]  Fax: [phone]   [facility]  [address]  [address]  [place] [address]

## 2011-08-26 LAB — CBC
HCT: 42.3
Hemoglobin: 14.6
MCV: 96.9
RDW: 13.2

## 2011-08-26 LAB — BASIC METABOLIC PANEL
CO2: 29
Chloride: 100
GFR calc non Af Amer: >60
Glucose, Bld: 98
Potassium: 4.2
Sodium: 139

## 2011-08-26 LAB — POCT CARDIAC MARKERS: Troponin i, poc: <0.05

## 2011-08-26 LAB — DIFFERENTIAL
Basophils Absolute: 0.1
Eosinophils Relative: 3
Lymphocytes Relative: 50 — ABNORMAL HIGH
Lymphs Abs: 3
Monocytes Absolute: 0.7

## 2011-08-26 LAB — CK TOTAL AND CKMB (NOT AT ARMC): Relative Index: 1.2

## 2011-08-27 LAB — CREATININE, SERUM
Creatinine, Ser: 0.81
Creatinine, Ser: 1.01
GFR calc Af Amer: >60
GFR calc Af Amer: >60
GFR calc non Af Amer: >60
GFR calc non Af Amer: >60

## 2011-08-27 LAB — POCT I-STAT 4, (NA,K, GLUC, HGB,HCT)
Glucose, Bld: 100 — ABNORMAL HIGH
Glucose, Bld: 103 — ABNORMAL HIGH
Glucose, Bld: 107 — ABNORMAL HIGH
Glucose, Bld: 128 — ABNORMAL HIGH
HCT: 27 — ABNORMAL LOW
HCT: 28 — ABNORMAL LOW
HCT: 36 — ABNORMAL LOW
Hemoglobin: 12.2 — ABNORMAL LOW
Hemoglobin: 9.2 — ABNORMAL LOW
Hemoglobin: 9.5 — ABNORMAL LOW
Operator id: 3406
Operator id: 3406
Potassium: 4.1
Sodium: 134 — ABNORMAL LOW
Sodium: 135
Sodium: 138

## 2011-08-27 LAB — CBC
HCT: 24.5 — ABNORMAL LOW
HCT: 25 — ABNORMAL LOW
HCT: 25 — ABNORMAL LOW
HCT: 25.9 — ABNORMAL LOW
HCT: 26.8 — ABNORMAL LOW
HCT: 27.3 — ABNORMAL LOW
HCT: 27.8 — ABNORMAL LOW
HCT: 40.3
HCT: 40.4
Hemoglobin: 13.9
Hemoglobin: 14.2
Hemoglobin: 8.4 — ABNORMAL LOW
Hemoglobin: 8.4 — ABNORMAL LOW
Hemoglobin: 8.4 — ABNORMAL LOW
Hemoglobin: 8.7 — ABNORMAL LOW
Hemoglobin: 9 — ABNORMAL LOW
Hemoglobin: 9.1 — ABNORMAL LOW
Hemoglobin: 9.3 — ABNORMAL LOW
MCHC: 33.3
MCHC: 33.3
MCHC: 33.6
MCHC: 33.6
MCHC: 33.8
MCHC: 33.8
MCHC: 33.9
MCHC: 34.4
MCHC: 35
MCV: 97.1
MCV: 98.3
MCV: 99.1
MCV: 99.1
MCV: 99.1
MCV: 99.2
MCV: 99.3
MCV: 99.6
MCV: 99.8
Platelets: 103 — ABNORMAL LOW
Platelets: 110 — ABNORMAL LOW
Platelets: 111 — ABNORMAL LOW
Platelets: 114 — ABNORMAL LOW
Platelets: 157
Platelets: 192
Platelets: 207
Platelets: 79 — ABNORMAL LOW
Platelets: 99 — ABNORMAL LOW
RBC: 2.49 — ABNORMAL LOW
RBC: 2.51 — ABNORMAL LOW
RBC: 2.52 — ABNORMAL LOW
RBC: 2.61 — ABNORMAL LOW
RBC: 2.7 — ABNORMAL LOW
RBC: 2.76 — ABNORMAL LOW
RBC: 2.79 — ABNORMAL LOW
RBC: 4.14 — ABNORMAL LOW
RBC: 4.16 — ABNORMAL LOW
RDW: 13.4
RDW: 13.8
RDW: 13.9
RDW: 13.9
RDW: 14
RDW: 14.1
RDW: 14.2
RDW: 14.3
RDW: 14.4
WBC: 4.9
WBC: 5
WBC: 5.2
WBC: 6.2
WBC: 7.1
WBC: 7.3
WBC: 7.6
WBC: 7.9
WBC: 8

## 2011-08-27 LAB — BASIC METABOLIC PANEL
BUN: 11
BUN: 13
BUN: 13
BUN: 14
BUN: 15
CO2: 25
CO2: 27
CO2: 31
CO2: 31
Calcium: 8 — ABNORMAL LOW
Calcium: 8 — ABNORMAL LOW
Calcium: 8.1 — ABNORMAL LOW
Calcium: 9.1
Chloride: 101
Chloride: 103
Chloride: 105
Chloride: 99
Chloride: 99
Creatinine, Ser: 0.85
Creatinine, Ser: 0.87
Creatinine, Ser: 0.88
Creatinine, Ser: 0.96
GFR calc Af Amer: >60
GFR calc Af Amer: >60
GFR calc Af Amer: >60
GFR calc Af Amer: >60
GFR calc non Af Amer: >60
GFR calc non Af Amer: >60
GFR calc non Af Amer: >60
GFR calc non Af Amer: >60
Glucose, Bld: 109 — ABNORMAL HIGH
Glucose, Bld: 122 — ABNORMAL HIGH
Glucose, Bld: 125 — ABNORMAL HIGH
Glucose, Bld: 128 — ABNORMAL HIGH
Glucose, Bld: 165 — ABNORMAL HIGH
Potassium: 3.8
Potassium: 3.9
Potassium: 3.9
Potassium: 4
Potassium: 4.5
Sodium: 135
Sodium: 136
Sodium: 139
Sodium: 139

## 2011-08-27 LAB — POCT I-STAT 3, ART BLOOD GAS (G3+)
Acid-base deficit: 1
Bicarbonate: 24.4 — ABNORMAL HIGH
Bicarbonate: 28.9 — ABNORMAL HIGH
O2 Saturation: 98
Operator id: 148841
Operator id: 297551
Operator id: 297551
Operator id: 3406
Patient temperature: 37.9
Patient temperature: 38.2
TCO2: 25
TCO2: 30
pH, Arterial: 7.333 — ABNORMAL LOW
pH, Arterial: 7.395
pH, Arterial: 7.437
pH, Arterial: 7.437

## 2011-08-27 LAB — URINALYSIS, ROUTINE W REFLEX MICROSCOPIC
Bilirubin Urine: NEGATIVE
Glucose, UA: NEGATIVE
Hgb urine dipstick: NEGATIVE
Ketones, ur: NEGATIVE
Nitrite: NEGATIVE
Protein, ur: NEGATIVE
Specific Gravity, Urine: 1.015
Urobilinogen, UA: 0.2
pH: 6.5

## 2011-08-27 LAB — POCT I-STAT, CHEM 8
BUN: 17
Calcium, Ion: 1.16
Chloride: 98
Creatinine, Ser: 1
Glucose, Bld: 161 — ABNORMAL HIGH
Hemoglobin: 9.2 — ABNORMAL LOW
Potassium: 4.3
Sodium: 142
TCO2: 27

## 2011-08-27 LAB — CARDIAC PANEL(CRET KIN+CKTOT+MB+TROPI)
CK, MB: 14.3 — ABNORMAL HIGH
CK, MB: 2.1
CK, MB: 6.1 — ABNORMAL HIGH
Relative Index: 0.9
Relative Index: 1.2
Relative Index: 2.6 — ABNORMAL HIGH
Total CK: 559 — ABNORMAL HIGH
Total CK: 681 — ABNORMAL HIGH
Troponin I: 1.03
Troponin I: 1.49
Troponin I: <0.01

## 2011-08-27 LAB — BLOOD GAS, ARTERIAL
Acid-Base Excess: 2.3 — ABNORMAL HIGH
Bicarbonate: 26.5 — ABNORMAL HIGH
O2 Saturation: 93.9
pO2, Arterial: 69.3 — ABNORMAL LOW

## 2011-08-27 LAB — PROTIME-INR
INR: 1.8 — ABNORMAL HIGH
Prothrombin Time: 21.4 — ABNORMAL HIGH

## 2011-08-27 LAB — MAGNESIUM
Magnesium: 2.6 — ABNORMAL HIGH
Magnesium: 2.7 — ABNORMAL HIGH
Magnesium: 3.2 — ABNORMAL HIGH

## 2011-08-27 LAB — TROPONIN I: Troponin I: <0.01

## 2011-08-27 LAB — TYPE AND SCREEN
ABO/RH(D): A POS
Antibody Screen: NEGATIVE

## 2011-08-27 LAB — HEMOGLOBIN A1C: Mean Plasma Glucose: 154

## 2011-08-27 LAB — APTT
aPTT: 38 — ABNORMAL HIGH
aPTT: 38 — ABNORMAL HIGH

## 2011-08-27 LAB — CK TOTAL AND CKMB (NOT AT ARMC)
Relative Index: 1
Total CK: 204

## 2011-08-27 LAB — POCT I-STAT GLUCOSE
Glucose, Bld: 117 — ABNORMAL HIGH
Operator id: 3406

## 2011-08-27 LAB — LIPID PANEL: VLDL: 25

## 2011-08-27 LAB — PLATELET COUNT: Platelets: 118 — ABNORMAL LOW

## 2011-11-15 ENCOUNTER — Emergency Department (HOSPITAL_BASED_OUTPATIENT_CLINIC_OR_DEPARTMENT_OTHER)
Admission: EM | Admit: 2011-11-15 | Discharge: 2011-11-15 | Disposition: A | Discharge status: Home / Self Care | Payer: Medicare Other | Attending: Emergency Medicine | Admitting: Emergency Medicine

## 2011-11-15 DIAGNOSIS — M79661 Pain in right lower leg: Secondary | ICD-10-CM

## 2011-11-15 DIAGNOSIS — I2510 Atherosclerotic heart disease of native coronary artery without angina pectoris: Secondary | ICD-10-CM | POA: Insufficient documentation

## 2011-11-15 DIAGNOSIS — E7800 Pure hypercholesterolemia, unspecified: Secondary | ICD-10-CM | POA: Insufficient documentation

## 2011-11-15 DIAGNOSIS — M79609 Pain in unspecified limb: Secondary | ICD-10-CM | POA: Insufficient documentation

## 2011-11-15 DIAGNOSIS — Z79899 Other long term (current) drug therapy: Secondary | ICD-10-CM | POA: Insufficient documentation

## 2011-11-15 DIAGNOSIS — I10 Essential (primary) hypertension: Secondary | ICD-10-CM | POA: Insufficient documentation

## 2011-11-15 HISTORY — DX: Essential (primary) hypertension: I10

## 2011-11-15 HISTORY — DX: Pure hypercholesterolemia, unspecified: E78.00

## 2011-11-15 MED ORDER — IBUPROFEN 600 MG PO TABS: 600.0000 mg | ORAL_TABLET | Freq: Three times a day (TID) | ORAL | Status: AC | PRN

## 2011-11-15 NOTE — ED Provider Notes (Signed)
I saw and evaluated the patient, reviewed the resident's note and I agree with the findings and plan.  Doubt DVT. No secondary signs of infection. Suspect muscle strain. Normal PT and DP pulses on the right. Doubt ischemia. Doubt referred pain. No GU complaints. No femoral nodes noted. Abdomen is nontender. No back pain  1. Pain of right lower leg      Lyanne Co, MD 11/15/11 2201

## 2011-11-15 NOTE — ED Provider Notes (Signed)
History     CSN: [id number] Arrival date & time: 11/15/2011 11:47 AM   First MD Initiated Contact with Patient 11/15/11 1154      Chief Complaint  Patient presents with  . Leg Pain    (Consider location/radiation/quality/duration/timing/severity/associated sxs/prior treatment) HPI Patient is a very pleasant 77 year old gentleman with a history of CAD status post CABG, otherwise healthy. Patient awoke this morning and felt a sharp pain in his right medial thigh that was worse with ambulation.  It does not radiate anywhere and stays in the medial and posterior thigh about 1/3 of the way down the thigh. The pain has eased off since his arrival to the ED.  He denies any symptoms like this in the past. He expressed some concern for either a blood clot or a muscle strain. The patient denies any prior pain in the legs with walking, no abdominal pain, no chest pain, no testicular or penile pain, and no changes in his bowel or bladder habits recently. He denies any trauma to the area, denies recent travel, or recent strain that he knows of. He has no history of cancer or hypercoagulability.  Past Medical History  Diagnosis Date  . Hypertension   . High cholesterol   . Gastric ulcer     Past Surgical History  Procedure Date  . Coronary artery bypass graft   . Gastric ulcer surgery     No family history on file.  History  Substance Use Topics  . Smoking status: Never Smoker   . Smokeless tobacco: Not on file  . Alcohol Use: No      Review of Systems As per HPI  Allergies  Penicillins  Home Medications   Current Outpatient Rx  Name Route Sig Dispense Refill  . ASPIRIN BUFFERED 325 MG PO TABS Oral Take 325 mg by mouth daily.      Marland Kitchen LISINOPRIL-HYDROCHLOROTHIAZIDE 20-12.5 MG PO TABS Oral Take 1 tablet by mouth daily.      Marland Kitchen POTASSIUM CHLORIDE CRYS CR 10 MEQ PO TBCR Oral Take 10 mEq by mouth daily.      Marland Kitchen SIMVASTATIN 20 MG PO TABS Oral Take 20 mg by mouth at bedtime.         BP 169/71  Pulse 66  Temp(Src) 97.8 F (36.6 C) (Oral)  Resp 20  Ht 5\' 8"  (1.727 m)  Wt 185 lb (83.915 kg)  BMI 28.13 kg/m2  SpO2 99%  Physical Exam Physical Exam GEN: NAD.  Alert and oriented x 3.  Pleasant, conversant, and cooperative to exam. RESP:  CTAB, no w/r/r CARDIOVASCULAR: RRR, S1, S2, no m/r/g ABDOMEN: soft, NT/ND, NABS EXT: warm and dry. No edema in b/l LE MSK: R thigh and leg with palpable distal pulses, good capillary refill, no asymmetry or edema observed.  5/5 strength proximally and distally in psoas, adductors, abductors, hip flex/extensors, TA, TP.  SILT.  Good ROM both active and passive.  ED Course  Procedures (including critical care time)  Labs Reviewed - No data to display No results found.   No diagnosis found.    MDM  77 year old man with CAD and no other significant past history presents with acute onset right inner thigh pain exacerbated by ambulation. Exam is completely benign, with excellent range of motion and strength, palpable pulses distally, and no surface or palpable abnormalities.  Cardiac rhythm is regular, no abdominal pain on exam.  Suspect muscle strain of the groin given patient's active lifestyle with bicycling frequently. Less likely DVT, but by  history patient has no risk factors, and by physical exam there is no indication of DVT. I do not think the patient needs a Doppler or d-dimer at this time.  Will d/c with rx for ibuprofen and with strict return precautions.       Kathreen Cosier, MD 11/15/11 1255

## 2011-11-15 NOTE — ED Notes (Signed)
Onset of right thigh pain when woke this am-denies injury-also c/o burning to right foot

## 2011-12-09 DIAGNOSIS — Z125 Encounter for screening for malignant neoplasm of prostate: Secondary | ICD-10-CM | POA: Diagnosis not present

## 2011-12-09 DIAGNOSIS — N400 Enlarged prostate without lower urinary tract symptoms: Secondary | ICD-10-CM | POA: Diagnosis not present

## 2011-12-09 DIAGNOSIS — I10 Essential (primary) hypertension: Secondary | ICD-10-CM | POA: Diagnosis not present

## 2011-12-09 DIAGNOSIS — E785 Hyperlipidemia, unspecified: Secondary | ICD-10-CM | POA: Diagnosis not present

## 2011-12-24 DIAGNOSIS — E7800 Pure hypercholesterolemia, unspecified: Secondary | ICD-10-CM | POA: Diagnosis not present

## 2011-12-24 DIAGNOSIS — Z7982 Long term (current) use of aspirin: Secondary | ICD-10-CM | POA: Diagnosis not present

## 2011-12-24 DIAGNOSIS — Z23 Encounter for immunization: Secondary | ICD-10-CM | POA: Diagnosis not present

## 2011-12-24 DIAGNOSIS — S61409A Unspecified open wound of unspecified hand, initial encounter: Secondary | ICD-10-CM | POA: Diagnosis not present

## 2011-12-24 DIAGNOSIS — I10 Essential (primary) hypertension: Secondary | ICD-10-CM | POA: Diagnosis not present

## 2011-12-24 DIAGNOSIS — Z79899 Other long term (current) drug therapy: Secondary | ICD-10-CM | POA: Diagnosis not present

## 2012-02-19 ENCOUNTER — Other Ambulatory Visit: Payer: Self-pay

## 2012-02-19 ENCOUNTER — Emergency Department (INDEPENDENT_AMBULATORY_CARE_PROVIDER_SITE_OTHER): Payer: Medicare Other

## 2012-02-19 ENCOUNTER — Inpatient Hospital Stay (HOSPITAL_BASED_OUTPATIENT_CLINIC_OR_DEPARTMENT_OTHER)
Admission: EM | Admit: 2012-02-19 | Discharge: 2012-02-21 | DRG: 556 | Disposition: A | Discharge status: Home / Self Care | Payer: Medicare Other | Attending: Internal Medicine | Admitting: Internal Medicine

## 2012-02-19 ENCOUNTER — Encounter (HOSPITAL_BASED_OUTPATIENT_CLINIC_OR_DEPARTMENT_OTHER): Payer: Self-pay | Admitting: *Deleted

## 2012-02-19 DIAGNOSIS — Z951 Presence of aortocoronary bypass graft: Secondary | ICD-10-CM

## 2012-02-19 DIAGNOSIS — E785 Hyperlipidemia, unspecified: Secondary | ICD-10-CM | POA: Diagnosis present

## 2012-02-19 DIAGNOSIS — I25810 Atherosclerosis of coronary artery bypass graft(s) without angina pectoris: Secondary | ICD-10-CM

## 2012-02-19 DIAGNOSIS — M79603 Pain in arm, unspecified: Secondary | ICD-10-CM | POA: Diagnosis present

## 2012-02-19 DIAGNOSIS — R071 Chest pain on breathing: Secondary | ICD-10-CM | POA: Diagnosis not present

## 2012-02-19 DIAGNOSIS — Z79899 Other long term (current) drug therapy: Secondary | ICD-10-CM

## 2012-02-19 DIAGNOSIS — Z7982 Long term (current) use of aspirin: Secondary | ICD-10-CM

## 2012-02-19 DIAGNOSIS — M25519 Pain in unspecified shoulder: Secondary | ICD-10-CM | POA: Diagnosis not present

## 2012-02-19 DIAGNOSIS — I2510 Atherosclerotic heart disease of native coronary artery without angina pectoris: Secondary | ICD-10-CM | POA: Diagnosis present

## 2012-02-19 DIAGNOSIS — R799 Abnormal finding of blood chemistry, unspecified: Secondary | ICD-10-CM

## 2012-02-19 DIAGNOSIS — R0602 Shortness of breath: Secondary | ICD-10-CM | POA: Diagnosis not present

## 2012-02-19 DIAGNOSIS — R079 Chest pain, unspecified: Secondary | ICD-10-CM

## 2012-02-19 DIAGNOSIS — I10 Essential (primary) hypertension: Secondary | ICD-10-CM | POA: Diagnosis present

## 2012-02-19 DIAGNOSIS — E7849 Other hyperlipidemia: Secondary | ICD-10-CM | POA: Diagnosis present

## 2012-02-19 DIAGNOSIS — R0789 Other chest pain: Secondary | ICD-10-CM | POA: Diagnosis not present

## 2012-02-19 DIAGNOSIS — Z880 Allergy status to penicillin: Secondary | ICD-10-CM

## 2012-02-19 DIAGNOSIS — M79609 Pain in unspecified limb: Secondary | ICD-10-CM | POA: Diagnosis not present

## 2012-02-19 HISTORY — DX: Atherosclerosis of coronary artery bypass graft(s) without angina pectoris: I25.810

## 2012-02-19 HISTORY — DX: Hyperlipidemia, unspecified: E78.5

## 2012-02-19 HISTORY — DX: Pain in arm, unspecified: M79.603

## 2012-02-19 HISTORY — DX: Gastro-esophageal reflux disease without esophagitis: K21.9

## 2012-02-19 HISTORY — DX: Essential (primary) hypertension: I10

## 2012-02-19 LAB — DIFFERENTIAL
Basophils Absolute: 0 10*3/uL (ref 0.0–0.1)
Basophils Relative: 1 % (ref 0–1)
Eosinophils Absolute: 0.2 10*3/uL (ref 0.0–0.7)
Monocytes Relative: 16 % — ABNORMAL HIGH (ref 3–12)
Neutro Abs: 1.7 10*3/uL (ref 1.7–7.7)
Neutrophils Relative %: 32 % — ABNORMAL LOW (ref 43–77)

## 2012-02-19 LAB — CK TOTAL AND CKMB (NOT AT ARMC)
CK, MB: 2.6 ng/mL (ref 0.3–4.0)
Relative Index: 1.2 (ref 0.0–2.5)
Total CK: 210 U/L (ref 7–232)

## 2012-02-19 LAB — COMPREHENSIVE METABOLIC PANEL
AST: 23 U/L (ref 0–37)
Albumin: 4.2 g/dL (ref 3.5–5.2)
Alkaline Phosphatase: 66 U/L (ref 39–117)
BUN: 16 mg/dL (ref 6–23)
Potassium: 3.8 mEq/L (ref 3.5–5.1)
Total Protein: 7.4 g/dL (ref 6.0–8.3)

## 2012-02-19 LAB — CBC
Hemoglobin: 14 g/dL (ref 13.0–17.0)
MCH: 33 pg (ref 26.0–34.0)
MCHC: 34.7 g/dL (ref 30.0–36.0)
Platelets: 225 10*3/uL (ref 150–400)
RDW: 13.8 % (ref 11.5–15.5)

## 2012-02-19 LAB — D-DIMER, QUANTITATIVE: D-Dimer, Quant: 0.82 ug/mL-FEU — ABNORMAL HIGH (ref 0.00–0.48)

## 2012-02-19 LAB — CARDIAC PANEL(CRET KIN+CKTOT+MB+TROPI)
Relative Index: 1.3 (ref 0.0–2.5)
Total CK: 187 U/L (ref 7–232)

## 2012-02-19 MED ORDER — ONDANSETRON HCL 4 MG/2ML IJ SOLN: 4.0000 mg | Freq: Four times a day (QID) | INTRAMUSCULAR | Status: DC | PRN

## 2012-02-19 MED ORDER — POTASSIUM CHLORIDE CRYS ER 10 MEQ PO TBCR
10.0000 meq | EXTENDED_RELEASE_TABLET | Freq: Every day | ORAL | Status: DC
  Administered 2012-02-19 – 2012-02-21 (×3): 10 meq via ORAL
  Filled 2012-02-19 (×3): qty 1

## 2012-02-19 MED ORDER — SODIUM CHLORIDE 0.9 % IJ SOLN
3.0000 mL | Freq: Two times a day (BID) | INTRAMUSCULAR | Status: DC
  Administered 2012-02-19 – 2012-02-20 (×3): 3 mL via INTRAVENOUS

## 2012-02-19 MED ORDER — ASPIRIN BUFFERED 325 MG PO TABS: 325.0000 mg | ORAL_TABLET | Freq: Every day | ORAL | Status: DC

## 2012-02-19 MED ORDER — HYDROCHLOROTHIAZIDE 12.5 MG PO CAPS
12.5000 mg | ORAL_CAPSULE | Freq: Every day | ORAL | Status: DC
  Administered 2012-02-19 – 2012-02-21 (×3): 12.5 mg via ORAL
  Filled 2012-02-19 (×3): qty 1

## 2012-02-19 MED ORDER — ENOXAPARIN SODIUM 40 MG/0.4ML ~~LOC~~ SOLN
40.0000 mg | SUBCUTANEOUS | Status: DC
  Administered 2012-02-19 – 2012-02-20 (×2): 40 mg via SUBCUTANEOUS
  Filled 2012-02-19 (×3): qty 0.4

## 2012-02-19 MED ORDER — IOHEXOL 350 MG/ML SOLN
100.0000 mL | Freq: Once | INTRAVENOUS | Status: AC | PRN
  Administered 2012-02-19: 100 mL via INTRAVENOUS

## 2012-02-19 MED ORDER — NITROGLYCERIN 0.4 MG SL SUBL: 0.4000 mg | SUBLINGUAL_TABLET | SUBLINGUAL | Status: DC | PRN

## 2012-02-19 MED ORDER — ASPIRIN EC 325 MG PO TBEC
325.0000 mg | DELAYED_RELEASE_TABLET | Freq: Every day | ORAL | Status: DC
  Administered 2012-02-19 – 2012-02-21 (×3): 325 mg via ORAL
  Filled 2012-02-19 (×3): qty 1

## 2012-02-19 MED ORDER — ATORVASTATIN CALCIUM 10 MG PO TABS
10.0000 mg | ORAL_TABLET | Freq: Every day | ORAL | Status: DC
  Administered 2012-02-19 – 2012-02-20 (×2): 10 mg via ORAL
  Filled 2012-02-19 (×3): qty 1

## 2012-02-19 MED ORDER — ACETAMINOPHEN 325 MG PO TABS: 650.0000 mg | ORAL_TABLET | ORAL | Status: DC | PRN

## 2012-02-19 MED ORDER — SIMVASTATIN 20 MG PO TABS
20.0000 mg | ORAL_TABLET | Freq: Every day | ORAL | Status: DC
  Filled 2012-02-19: qty 1

## 2012-02-19 MED ORDER — SODIUM CHLORIDE 0.9 % IV SOLN: 250.0000 mL | INTRAVENOUS | Status: DC | PRN

## 2012-02-19 MED ORDER — LISINOPRIL 20 MG PO TABS
20.0000 mg | ORAL_TABLET | Freq: Every day | ORAL | Status: DC
  Administered 2012-02-19 – 2012-02-21 (×3): 20 mg via ORAL
  Filled 2012-02-19 (×3): qty 1

## 2012-02-19 MED ORDER — HYDRALAZINE HCL 20 MG/ML IJ SOLN
10.0000 mg | Freq: Three times a day (TID) | INTRAMUSCULAR | Status: DC | PRN
  Filled 2012-02-19: qty 0.5

## 2012-02-19 MED ORDER — SODIUM CHLORIDE 0.9 % IJ SOLN: 3.0000 mL | INTRAMUSCULAR | Status: DC | PRN

## 2012-02-19 MED ORDER — MORPHINE SULFATE 2 MG/ML IJ SOLN
2.0000 mg | Freq: Once | INTRAMUSCULAR | Status: AC
  Administered 2012-02-19: 2 mg via INTRAVENOUS
  Filled 2012-02-19: qty 1

## 2012-02-19 MED ORDER — LISINOPRIL-HYDROCHLOROTHIAZIDE 20-12.5 MG PO TABS: 1.0000 | ORAL_TABLET | Freq: Every day | ORAL | Status: DC

## 2012-02-19 MED ORDER — SIMVASTATIN 20 MG PO TABS: 20.0000 mg | ORAL_TABLET | Freq: Every day | ORAL | Status: DC

## 2012-02-19 NOTE — ED Notes (Signed)
Phone report given to [facility]. °

## 2012-02-19 NOTE — H&P (Addendum)
Patient's PCP: Katy Apo, MD, MD  Chief Complaint: R chest soreness getting up  History of Present Illness: Sean Kelley is a 78 y.o.  male who presented at [facility] with complaints of pain that began in right arm three days ago upper arm, achey in nature. Pain constant, felt like strained muscle. Radiating into right chest. Pain worsens with deep breath or getting up from sitting position.  Patient took some aspirin that helped alleviate the pain.  At the ER was found to have an elevated d-dimer and a CTA was done with no PE.  No swelling in the lower extremities.  Patient has been doing more heavy lifting recently at his job. No SOB  Still with some pain.  Asking for dinner.   Meds: Scheduled Meds:    .  morphine injection  2 mg Intravenous Once   Continuous Infusions:  PRN Meds:.iohexol Allergies: Penicillins Past Medical History  Diagnosis Date  . Hypertension   . High cholesterol   . Gastric ulcer    Past Surgical History  Procedure Date  . Coronary artery bypass graft   . Gastric ulcer surgery    No family history on file. History   Social History  . Marital Status: Widowed    Spouse Name: N/A    Number of Children: N/A  . Years of Education: N/A   Occupational History  . Not on file.   Social History Main Topics  . Smoking status: Never Smoker   . Smokeless tobacco: Not on file  . Alcohol Use: No  . Drug Use: No  . Sexually Active:    Other Topics Concern  . Not on file   Social History Narrative  . No narrative on file   Review of Systems: All systems reviewed with the patient and positive as per history of present illness, otherwise all other systems are negative.   Physical Exam: Blood pressure 162/73, pulse 57, temperature 97.6 F (36.4 C), temperature source Oral, resp. rate 16, weight 90.6 kg (199 lb 11.8 oz), SpO2 97.00%. General: Awake, Oriented x3, No acute distress. Very pleasant HEENT: EOMI, Moist mucous  membranes Neck: Supple CV: S1 and S2 Lungs: Clear to ascultation bilaterally Abdomen: Soft, Nontender, Nondistended, +bowel sounds. Ext: Good pulses. Trace edema. No clubbing or cyanosis noted. Neuro: Cranial Nerves II-XII grossly intact. Has 5/5 motor strength in upper and lower extremities.    Lab results:  Basename 02/19/12 1000  NA 137  K 3.8  CL 99  CO2 30  GLUCOSE 102*  BUN 16  CREATININE 1.00  CALCIUM 9.7  MG --  PHOS --    Basename 02/19/12 1000  AST 23  ALT 20  ALKPHOS 66  BILITOT 0.3  PROT 7.4  ALBUMIN 4.2   No results found for this basename: LIPASE:2,AMYLASE:2 in the last 72 hours  Basename 02/19/12 1000  WBC 5.2  NEUTROABS 1.7  HGB 14.0  HCT 40.3  MCV 95.0  PLT 225    Basename 02/19/12 1515 02/19/12 1000  CKTOTAL 210 --  CKMB 2.6 --  CKMBINDEX -- --  TROPONINI -- <0.30   No components found with this basename: POCBNP:3  Basename 02/19/12 1000  DDIMER 0.82*   No results found for this basename: HGBA1C:2 in the last 72 hours No results found for this basename: CHOL:2,HDL:2,LDLCALC:2,TRIG:2,CHOLHDL:2,LDLDIRECT:2 in the last 72 hours No results found for this basename: TSH,T4TOTAL,FREET3,T3FREE,THYROIDAB in the last 72 hours No results found for this basename: VITAMINB12:2,FOLATE:2,FERRITIN:2,TIBC:2,IRON:2,RETICCTPCT:2 in the last 72 hours  Imaging results:  Dg Chest 2 View  02/19/2012  *RADIOLOGY REPORT*  Clinical Data: Right chest pain, shortness of breath  CHEST - 2 VIEW  Comparison: 07/19/2008  Findings: Stable mild elevation of the right hemithorax.  Lungs are essentially clear. No pleural effusion or pneumothorax.  Cardiomediastinal silhouette is within normal limits. Postsurgical changes related to prior CABG.  Mild degenerative changes of the visualized thoracolumbar spine.  Moderate debris in the stomach.  IMPRESSION: No evidence of acute cardiopulmonary disease.  Original Report Authenticated By: Charline Bills, M.D.   Ct Angio Chest  W/cm &/or Wo Cm  02/19/2012  *RADIOLOGY REPORT*  Clinical Data: Right pleuritic chest pain, shortness of breath, elevated D-dimer, evaluate for PE  CT ANGIOGRAPHY CHEST  Technique:  Multidetector CT imaging of the chest using the standard protocol during bolus administration of intravenous contrast. Multiplanar reconstructed images including MIPs were obtained and reviewed to evaluate the vascular anatomy.  Contrast: OMNIPAQUE IOHEXOL 350 MG/ML IV SOLN  Comparison: Chest radiographs dated 02/19/2012  Findings: No evidence of pulmonary embolism.  Lungs are essentially clear.  Elevation of the right hemidiaphragm with associated plate-like basilar atelectasis. No pleural effusion or pneumothorax.  1.6 x 1.3 cm subpleural lipoma along the anterior aspect of the right upper lobe (series 4/image 28).  Visualized thyroid is unremarkable.  The heart is normal in size.  Mild prominence of the main pulmonary artery, raising the possibility of pulmonary arterial hypertension. Coronary atherosclerosis. Postsurgical changes related to prior CABG.  Atherosclerotic calcifications of the aortic arch.  Visualized upper abdomen is unremarkable.  Mild degenerative changes of the visualized thoracolumbar spine.  IMPRESSION: No evidence of pulmonary embolism.  No evidence of acute cardiopulmonary disease.  Elevation of the right hemidiaphragm with associated plate-like basilar atelectasis.  Original Report Authenticated By: Charline Bills, M.D.   Other results: EKG: NSR, non specific EKG changes   Assessment & Plan by Problem: Arm Pain- R going into R chest- with history of CAD/CABG will get CE and EKG in AM Cardiologist is Dr. Katrinka Blazing - no need to consult at this time, defer to PCP Sounds very musculoskeletal as he has been doing heavy lifting, PRN tylenol ASA, ACE (no BB as pt has low HR already)  HTN- continue home meds, PRN meds  CAD/CABG- ASA  HLD  Elevated D-dimer- CTA negative   Time spent on  admission, talking to the patient, and coordinating care was: 50 mins.  Sandralee Tarkington, DO 02/19/2012, 6:02 PM

## 2012-02-19 NOTE — ED Notes (Signed)
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

## 2012-02-19 NOTE — ED Provider Notes (Signed)
History     CSN: [id number]  Arrival date & time 02/19/12  0941   First MD Initiated Contact with Patient 02/19/12 1011      Chief Complaint  Patient presents with  . Arm Pain  . Chest Pain    (Consider location/radiation/quality/duration/timing/severity/associated sxs/prior treatment) HPI Pain began in right arm three days ago upper arm, achey in nature.  Pain constant, felt like strained muscle.  Began radiating into chest 2 days ago.  Pain is constant but waxes and wanes, worsens with deep breath or getting up from sitting position.  No previous episodes of same.  Denies dyspnea, cough, fever, no injury.  S/p cabg 3 years ago.  Pain with previous angina was somewhat similar.  Cardiologist is Dr. Verdis Prime, Polite is pmd. Pain is present now with deep breath at 3/10.  No history of dvt, no trauma, or prolonged sitting.  Past Medical History  Diagnosis Date  . Hypertension   . High cholesterol   . Gastric ulcer     Past Surgical History  Procedure Date  . Coronary artery bypass graft   . Gastric ulcer surgery     No family history on file.  History  Substance Use Topics  . Smoking status: Never Smoker   . Smokeless tobacco: Not on file  . Alcohol Use: No      Review of Systems  All other systems reviewed and are negative.    Allergies  Penicillins  Home Medications   Current Outpatient Rx  Name Route Sig Dispense Refill  . ASPIRIN BUFFERED 325 MG PO TABS Oral Take 325 mg by mouth daily.      Marland Kitchen LISINOPRIL-HYDROCHLOROTHIAZIDE 20-12.5 MG PO TABS Oral Take 1 tablet by mouth daily.      Marland Kitchen POTASSIUM CHLORIDE CRYS ER 10 MEQ PO TBCR Oral Take 10 mEq by mouth daily.      Marland Kitchen SIMVASTATIN 20 MG PO TABS Oral Take 20 mg by mouth at bedtime.        BP 150/62  Pulse 67  Temp(Src) 98 F (36.7 C) (Oral)  Resp 18  SpO2 96%  Physical Exam  Nursing note and vitals reviewed. Constitutional: He is oriented to person, place, and time. He appears well-developed and  well-nourished.  HENT:  Head: Normocephalic and atraumatic.  Right Ear: External ear normal.  Left Ear: External ear normal.  Nose: Nose normal.  Mouth/Throat: Oropharynx is clear and moist.  Eyes: Conjunctivae and EOM are normal. Pupils are equal, round, and reactive to light.  Neck: Normal range of motion. Neck supple.  Cardiovascular: Normal rate, regular rhythm, normal heart sounds and intact distal pulses.   Pulmonary/Chest: Effort normal and breath sounds normal.  Abdominal: Soft. Bowel sounds are normal.  Musculoskeletal: Normal range of motion.  Neurological: He is alert and oriented to person, place, and time. He has normal reflexes.  Skin: Skin is warm and dry.  Psychiatric: He has a normal mood and affect. His behavior is normal. Thought content normal.    ED Course  Procedures (including critical care time)  Labs Reviewed - No data to display No results found.   No diagnosis found.   Date: 02/19/2012  Rate: 65  Rhythm: normal sinus rhythm  QRS Axis: left  Intervals: normal  ST/T Wave abnormalities: nonspecific T wave changes, anterior infarct  Conduction Disutrbances:  Narrative Interpretation:   Old EKG Reviewed: changes noted    MDM  CRITICAL CARE Performed by: Hilario Quarry   Total critical care time:  45  Critical care time was exclusive of separately billable procedures and treating other patients.  Critical care was necessary to treat or prevent imminent or life-threatening deterioration.  Critical care was time spent personally by me on the following activities: development of treatment plan with patient and/or surrogate as well as nursing, discussions with consultants, evaluation of patient's response to treatment, examination of patient, obtaining history from patient or surrogate, ordering and performing treatments and interventions, ordering and review of laboratory studies, ordering and review of radiographic studies, pulse oximetry and  re-evaluation of patient's condition.   Patient with nonspecific changes on EKG. He had right arm pain procedure the right chest pain. The right chest pain is atypical and is pleuritic in nature. However given the patient's cardiac risk factors, he will be admitted to the hospital overnight and ruled out for myocardial infarction.  Patient with or pleuritic chest pain and elevated d-dimer. Patient had CT angiogram which is negative for pulmonary embolism.     Hilario Quarry, MD 02/19/12 [phone]

## 2012-02-20 ENCOUNTER — Other Ambulatory Visit: Payer: Self-pay

## 2012-02-20 LAB — CARDIAC PANEL(CRET KIN+CKTOT+MB+TROPI)
Relative Index: 1.3 (ref 0.0–2.5)
Relative Index: 1.2 (ref 0.0–2.5)
Total CK: 173 U/L (ref 7–232)
Troponin I: <0.3 ng/mL (ref <0.30)

## 2012-02-20 LAB — BASIC METABOLIC PANEL
GFR calc Af Amer: 90 mL/min — ABNORMAL LOW (ref >90)
GFR calc non Af Amer: 77 mL/min — ABNORMAL LOW (ref >90)
Potassium: 4.1 mEq/L (ref 3.5–5.1)
Sodium: 137 mEq/L (ref 135–145)

## 2012-02-20 LAB — LIPID PANEL
HDL: 40 mg/dL (ref >39)
Triglycerides: 82 mg/dL (ref <150)

## 2012-02-20 LAB — CBC
MCHC: 33.2 g/dL (ref 30.0–36.0)
RDW: 14 % (ref 11.5–15.5)

## 2012-02-20 MED ORDER — PREDNISONE 20 MG PO TABS
20.0000 mg | ORAL_TABLET | Freq: Two times a day (BID) | ORAL | Status: DC
  Administered 2012-02-20 – 2012-02-21 (×2): 20 mg via ORAL
  Filled 2012-02-20 (×4): qty 1

## 2012-02-20 NOTE — Progress Notes (Signed)
Subjective: C/o right chest upper sided pain, with movement and also with deep breathing CT- no PE Cardiac marker negative Less likely cardiac pleurisy  Had report of recent cold. Also some physical activity  Objective: Vital signs in last 24 hours: Temp:  [97.6 F (36.4 C)-98.1 F (36.7 C)] 98.1 F (36.7 C) (03/24 0554) Pulse Rate:  [57-67] 58  (03/24 0554) Resp:  [12-19] 19  (03/24 0554) BP: (108-162)/(53-73) 108/56 mmHg (03/24 0554) SpO2:  [95 %-97 %] 95 % (03/24 0554) Weight:  [90.6 kg (199 lb 11.8 oz)] 90.6 kg (199 lb 11.8 oz) (03/23 1710) Weight change:  Last BM Date: 02/18/12  Intake/Output from previous day:   Intake/Output this shift:    General appearance: alert Resp: clear to auscultation bilaterally Chest wall: no tenderness Cardio: regular rate and rhythm  Lab Results:  Basename 02/20/12 0605 02/19/12 1000  WBC 3.4* 5.2  HGB 13.1 14.0  HCT 39.5 40.3  PLT 214 225   BMET  Basename 02/20/12 0605 02/19/12 1000  NA 137 137  K 4.1 3.8  CL 101 99  CO2 27 30  GLUCOSE 107* 102*  BUN 16 16  CREATININE 0.93 1.00  CALCIUM 9.0 9.7    Studies/Results: Dg Chest 2 View  02/19/2012  *RADIOLOGY REPORT*  Clinical Data: Right chest pain, shortness of breath  CHEST - 2 VIEW  Comparison: 07/19/2008  Findings: Stable mild elevation of the right hemithorax.  Lungs are essentially clear. No pleural effusion or pneumothorax.  Cardiomediastinal silhouette is within normal limits. Postsurgical changes related to prior CABG.  Mild degenerative changes of the visualized thoracolumbar spine.  Moderate debris in the stomach.  IMPRESSION: No evidence of acute cardiopulmonary disease.  Original Report Authenticated By: Charline Bills, M.D.   Ct Angio Chest W/cm &/or Wo Cm  02/19/2012  *RADIOLOGY REPORT*  Clinical Data: Right pleuritic chest pain, shortness of breath, elevated D-dimer, evaluate for PE  CT ANGIOGRAPHY CHEST  Technique:  Multidetector CT imaging of the chest using  the standard protocol during bolus administration of intravenous contrast. Multiplanar reconstructed images including MIPs were obtained and reviewed to evaluate the vascular anatomy.  Contrast: OMNIPAQUE IOHEXOL 350 MG/ML IV SOLN  Comparison: Chest radiographs dated 02/19/2012  Findings: No evidence of pulmonary embolism.  Lungs are essentially clear.  Elevation of the right hemidiaphragm with associated plate-like basilar atelectasis. No pleural effusion or pneumothorax.  1.6 x 1.3 cm subpleural lipoma along the anterior aspect of the right upper lobe (series 4/image 28).  Visualized thyroid is unremarkable.  The heart is normal in size.  Mild prominence of the main pulmonary artery, raising the possibility of pulmonary arterial hypertension. Coronary atherosclerosis. Postsurgical changes related to prior CABG.  Atherosclerotic calcifications of the aortic arch.  Visualized upper abdomen is unremarkable.  Mild degenerative changes of the visualized thoracolumbar spine.  IMPRESSION: No evidence of pulmonary embolism.  No evidence of acute cardiopulmonary disease.  Elevation of the right hemidiaphragm with associated plate-like basilar atelectasis.  Original Report Authenticated By: Charline Bills, M.D.    Medications: I have reviewed the patient's current medications.  Assessment/Plan: CP/ right sided DDX musluar plurisy R/o cardiac CT negative for PE Prednisone low dose Ambulate today.  LOS: 1 day   Sanyla Summey 02/20/2012, 9:52 AM

## 2012-02-21 ENCOUNTER — Other Ambulatory Visit: Payer: Self-pay

## 2012-02-21 NOTE — Discharge Summary (Signed)
Physician Discharge Summary  [name] MRN: [id number] DOB/AGE: 76/03/1930 76 y.o.  Admit date: 02/19/2012 Discharge date: 02/21/2012  Admission Diagnoses: right chest soreness  Discharge Diagnoses:  Active Problems:  HTN (hypertension)  Arm pain  CAD (coronary artery disease) of bypass graft  HLD (hyperlipidemia)  Atypical chest pain, felt to be musculoskeletal in etiology. Cardiac enzymes negative for ischemia, EKG without acute changes, CT of the chest negative for PE. Improvement in right shoulder pain with NSAIDs. Discharged Condition: stable  Hospital Course:   Patient was admitted to hospital after presenting to emergency room with a right-sided pain. EKG normal, cardiac enzymes negative. D-dimer was elevated, subsequent CT the chest was negative. Exam and history was consistent with muscle scuttled etiology. Because of patient's age and risk factors admission was deemed necessary to rule out MI. Patient ruled out for MI. He ambulated without difficulty. Patient's pain was reproducible with movement of the right upper extremity. Particularly when pressure was applied to the abducted arm suggested probable rotator tendinopathy. Patient did receive relief with NSAIDs. Consults:   none  Significant Diagnostic Studies:Dg Chest 2 View  02/19/2012  *RADIOLOGY REPORT*  Clinical Data: Right chest pain, shortness of breath  CHEST - 2 VIEW  Comparison: 07/19/2008  Findings: Stable mild elevation of the right hemithorax.  Lungs are essentially clear. No pleural effusion or pneumothorax.  Cardiomediastinal silhouette is within normal limits. Postsurgical changes related to prior CABG.  Mild degenerative changes of the visualized thoracolumbar spine.  Moderate debris in the stomach.  IMPRESSION: No evidence of acute cardiopulmonary disease.  Original Report Authenticated By: Charline Bills, M.D.   Ct Angio Chest W/cm &/or Wo Cm  02/19/2012  *RADIOLOGY REPORT*  Clinical Data:  Right pleuritic chest pain, shortness of breath, elevated D-dimer, evaluate for PE  CT ANGIOGRAPHY CHEST  Technique:  Multidetector CT imaging of the chest using the standard protocol during bolus administration of intravenous contrast. Multiplanar reconstructed images including MIPs were obtained and reviewed to evaluate the vascular anatomy.  Contrast: OMNIPAQUE IOHEXOL 350 MG/ML IV SOLN  Comparison: Chest radiographs dated 02/19/2012  Findings: No evidence of pulmonary embolism.  Lungs are essentially clear.  Elevation of the right hemidiaphragm with associated plate-like basilar atelectasis. No pleural effusion or pneumothorax.  1.6 x 1.3 cm subpleural lipoma along the anterior aspect of the right upper lobe (series 4/image 28).  Visualized thyroid is unremarkable.  The heart is normal in size.  Mild prominence of the main pulmonary artery, raising the possibility of pulmonary arterial hypertension. Coronary atherosclerosis. Postsurgical changes related to prior CABG.  Atherosclerotic calcifications of the aortic arch.  Visualized upper abdomen is unremarkable.  Mild degenerative changes of the visualized thoracolumbar spine.  IMPRESSION: No evidence of pulmonary embolism.  No evidence of acute cardiopulmonary disease.  Elevation of the right hemidiaphragm with associated plate-like basilar atelectasis.  Original Report Authenticated By: Charline Bills, M.D.      Discharge Exam: Blood pressure 127/68, pulse 75, temperature 97.8 F (36.6 C), temperature source Oral, resp. rate 19, height 5\' 8"  (1.727 m), weight 90.6 kg (199 lb 11.8 oz), SpO2 95.00%. General appearance: alert and cooperative Resp: clear to auscultation bilaterally Cardio: regular rate and rhythm, S1, S2 normal, no murmur, click, rub or gallop Extremities: no clubbing cyanosis or edema. Patient had full range of motion of right upper extremity. He did complain of pain with pressure was applied to the abducted arm  Disposition:  01-Home or Self Care   Medication List  As of 02/21/2012  9:15 AM   TAKE these medications         aspirin 325 MG buffered tablet   Take 325 mg by mouth daily.      lisinopril-hydrochlorothiazide 20-12.5 MG per tablet   Commonly known as: PRINZIDE,ZESTORETIC   Take 1 tablet by mouth daily.      potassium chloride 10 MEQ tablet   Commonly known as: K-DUR,KLOR-CON   Take 10 mEq by mouth daily.      simvastatin 20 MG tablet   Commonly known as: ZOCOR   Take 20 mg by mouth at bedtime.           Follow-up Information    Follow up with Deloss Amico D, MD. (As needed)    Contact information:   [address] [place] [address] [phone]          Signed: Katy Apo 02/21/2012, 9:15 AM

## 2012-02-21 NOTE — Progress Notes (Signed)
Pt given discharge instructions, medication lists, follow up appointments, and when to call the doctor.  Pt verbalized understanding of instructions. Timothea Bodenheimer McClintock  

## 2012-03-09 DIAGNOSIS — R0789 Other chest pain: Secondary | ICD-10-CM | POA: Diagnosis not present

## 2012-03-09 DIAGNOSIS — I10 Essential (primary) hypertension: Secondary | ICD-10-CM | POA: Diagnosis not present

## 2012-03-09 DIAGNOSIS — R109 Unspecified abdominal pain: Secondary | ICD-10-CM | POA: Diagnosis not present

## 2012-03-09 DIAGNOSIS — E785 Hyperlipidemia, unspecified: Secondary | ICD-10-CM | POA: Diagnosis not present

## 2012-03-17 DIAGNOSIS — E785 Hyperlipidemia, unspecified: Secondary | ICD-10-CM | POA: Diagnosis not present

## 2012-07-26 DIAGNOSIS — I10 Essential (primary) hypertension: Secondary | ICD-10-CM | POA: Diagnosis not present

## 2012-07-26 DIAGNOSIS — E785 Hyperlipidemia, unspecified: Secondary | ICD-10-CM | POA: Diagnosis not present

## 2012-07-26 DIAGNOSIS — N529 Male erectile dysfunction, unspecified: Secondary | ICD-10-CM | POA: Diagnosis not present

## 2012-07-26 DIAGNOSIS — I2510 Atherosclerotic heart disease of native coronary artery without angina pectoris: Secondary | ICD-10-CM | POA: Diagnosis not present

## 2012-08-01 DIAGNOSIS — Z0279 Encounter for issue of other medical certificate: Secondary | ICD-10-CM

## 2012-11-07 DIAGNOSIS — I2510 Atherosclerotic heart disease of native coronary artery without angina pectoris: Secondary | ICD-10-CM | POA: Diagnosis not present

## 2012-11-07 DIAGNOSIS — E785 Hyperlipidemia, unspecified: Secondary | ICD-10-CM | POA: Diagnosis not present

## 2012-11-07 DIAGNOSIS — I10 Essential (primary) hypertension: Secondary | ICD-10-CM | POA: Diagnosis not present

## 2013-01-31 DIAGNOSIS — Z0000 Encounter for general adult medical examination without abnormal findings: Secondary | ICD-10-CM | POA: Diagnosis not present

## 2013-01-31 DIAGNOSIS — E785 Hyperlipidemia, unspecified: Secondary | ICD-10-CM | POA: Diagnosis not present

## 2013-01-31 DIAGNOSIS — I10 Essential (primary) hypertension: Secondary | ICD-10-CM | POA: Diagnosis not present

## 2013-05-24 ENCOUNTER — Emergency Department (HOSPITAL_BASED_OUTPATIENT_CLINIC_OR_DEPARTMENT_OTHER)
Admission: EM | Admit: 2013-05-24 | Discharge: 2013-05-24 | Disposition: A | Discharge status: Home / Self Care | Payer: Medicare Other | Attending: Emergency Medicine | Admitting: Emergency Medicine

## 2013-05-24 ENCOUNTER — Encounter (HOSPITAL_BASED_OUTPATIENT_CLINIC_OR_DEPARTMENT_OTHER): Payer: Self-pay | Admitting: *Deleted

## 2013-05-24 DIAGNOSIS — Z951 Presence of aortocoronary bypass graft: Secondary | ICD-10-CM | POA: Insufficient documentation

## 2013-05-24 DIAGNOSIS — Z8719 Personal history of other diseases of the digestive system: Secondary | ICD-10-CM | POA: Insufficient documentation

## 2013-05-24 DIAGNOSIS — Z79899 Other long term (current) drug therapy: Secondary | ICD-10-CM | POA: Insufficient documentation

## 2013-05-24 DIAGNOSIS — Z7982 Long term (current) use of aspirin: Secondary | ICD-10-CM | POA: Insufficient documentation

## 2013-05-24 DIAGNOSIS — W57XXXA Bitten or stung by nonvenomous insect and other nonvenomous arthropods, initial encounter: Secondary | ICD-10-CM | POA: Diagnosis not present

## 2013-05-24 DIAGNOSIS — Z880 Allergy status to penicillin: Secondary | ICD-10-CM | POA: Insufficient documentation

## 2013-05-24 DIAGNOSIS — S30860A Insect bite (nonvenomous) of lower back and pelvis, initial encounter: Secondary | ICD-10-CM | POA: Diagnosis not present

## 2013-05-24 DIAGNOSIS — IMO0002 Reserved for concepts with insufficient information to code with codable children: Secondary | ICD-10-CM | POA: Insufficient documentation

## 2013-05-24 DIAGNOSIS — I10 Essential (primary) hypertension: Secondary | ICD-10-CM | POA: Insufficient documentation

## 2013-05-24 DIAGNOSIS — E7800 Pure hypercholesterolemia, unspecified: Secondary | ICD-10-CM | POA: Diagnosis not present

## 2013-05-24 DIAGNOSIS — X58XXXA Exposure to other specified factors, initial encounter: Secondary | ICD-10-CM | POA: Insufficient documentation

## 2013-05-24 DIAGNOSIS — Y929 Unspecified place or not applicable: Secondary | ICD-10-CM | POA: Insufficient documentation

## 2013-05-24 DIAGNOSIS — Y939 Activity, unspecified: Secondary | ICD-10-CM | POA: Insufficient documentation

## 2013-05-24 MED ORDER — TETANUS-DIPHTH-ACELL PERTUSSIS 5-2.5-18.5 LF-MCG/0.5 IM SUSP: 0.5000 mL | Freq: Once | INTRAMUSCULAR | Status: DC

## 2013-05-24 MED ORDER — DOXYCYCLINE HYCLATE 100 MG PO CAPS: 100.0000 mg | ORAL_CAPSULE | Freq: Two times a day (BID) | ORAL | Status: DC

## 2013-05-24 NOTE — ED Notes (Signed)
Unknown bite on left ankle states swollen and draining also pulled a tick from groin this am

## 2013-05-24 NOTE — ED Provider Notes (Signed)
History    CSN: [id number] Arrival date & time 05/24/13  1005  First MD Initiated Contact with Patient 05/24/13 1031     Chief Complaint  Patient presents with  . tick bite    (Consider location/radiation/quality/duration/timing/severity/associated sxs/prior Treatment) HPI Comments: Patient presents with some small amounts and is growing. He states that this morning he pulled a tick off of his left testicle. He also has another bump which she feels might be a tick in his left groin. He's noted 2 small sores in his groin over the last few days. He also noted yesterday a small blister started on his left lower leg. 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 He's not sure when his last tetanus shot was. He denies any systemic signs of illness such as fever vomiting myalgias or joint aches. He denies any other rashes.  Past Medical History  Diagnosis Date  . Hypertension   . High cholesterol   . Gastric ulcer   . GERD (gastroesophageal reflux disease)    Past Surgical History  Procedure Laterality Date  . Coronary artery bypass graft    . Gastric ulcer surgery     History reviewed. No pertinent family history. History  Substance Use Topics  . Smoking status: Never Smoker   . Smokeless tobacco: Not on file  . Alcohol Use: No    Review of Systems  Constitutional: Negative for fever, chills, diaphoresis and fatigue.  HENT: Negative for congestion, rhinorrhea and sneezing.   Eyes: Negative.   Respiratory: Negative for cough, chest tightness and shortness of breath.   Cardiovascular: Negative for chest pain and leg swelling.  Gastrointestinal: Negative for nausea, vomiting, abdominal pain, diarrhea and blood in stool.  Genitourinary: Negative for frequency, hematuria, flank pain and difficulty urinating.  Musculoskeletal: Negative for myalgias, back pain, joint swelling and arthralgias.  Skin:  Positive for wound. Negative for rash.  Neurological: Negative for dizziness, speech difficulty, weakness, numbness and headaches.    Allergies  Penicillins  Home Medications   Current Outpatient Rx  Name  Route  Sig  Dispense  Refill  . aspirin 325 MG buffered tablet   Oral   Take 325 mg by mouth daily.           Marland Kitchen doxycycline (VIBRAMYCIN) 100 MG capsule   Oral   Take 1 capsule (100 mg total) by mouth 2 (two) times daily. One po bid x 10 days   20 capsule   0   . lisinopril-hydrochlorothiazide (PRINZIDE,ZESTORETIC) 20-12.5 MG per tablet   Oral   Take 1 tablet by mouth daily.           . potassium chloride (K-DUR,KLOR-CON) 10 MEQ tablet   Oral   Take 10 mEq by mouth daily.           . simvastatin (ZOCOR) 20 MG tablet   Oral   Take 20 mg by mouth at bedtime.            BP 154/69  Pulse 74  Temp(Src) 98.6 F (37 C) (Oral)  SpO2 98% Physical Exam  Constitutional: He is oriented to person, place, and time. He appears well-developed and well-nourished.  HENT:  Head: Normocephalic and atraumatic.  Eyes: Pupils are equal, round, and reactive to light.  Neck: Normal range of motion. Neck supple.  Cardiovascular: Normal rate, regular rhythm and normal heart sounds.  Pulmonary/Chest: Effort normal and breath sounds normal. No respiratory distress. He has no wheezes. He has no rales. He exhibits no tenderness.  Abdominal: Soft. Bowel sounds are normal. There is no tenderness. There is no rebound and no guarding.  Musculoskeletal: Normal range of motion. He exhibits no edema.  Lymphadenopathy:    He has no cervical adenopathy.  Neurological: He is alert and oriented to person, place, and time.  Skin: Skin is warm and dry. No rash noted.  Patient had a small brown tick to his left inguinal area which was removed by me with forceps. He also has a small papule which is slightly erythematous to his left inguinal area. He has another small papule to his right inguinal  area which is also slightly erythematous. He has a 1 cm bullae with clear fluid to his left pretibial area. There is no erythema drainage or signs of infection.  Psychiatric: He has a normal mood and affect.    ED Course  Procedures (including critical care time) Labs Reviewed - No data to display No results found. 1. Tick bite     MDM  Patient presented with a tick bite. He has no cyst panic signs or suggestions of [facility] spotted fever or Lyme disease. One tick was removed here in emergency department. He has a small bullae to his lower leg which could represent a spider bite. It is not appear to be infected. He has 2 small papules in his inguinal areas which could be related to previous tick bites. However they look to be very small early abscesses. There is no induration or fluctuance to the areas and nothing that would warrant I&D. They are very small. However given this I will go ahead and start him on antibiotics. Given the history of tick bite I will start doxycycline versus Bactrim. His tetanus shot was updated. I advised him to followup with his primary care physician within a few days for a recheck on the areas or I also advised him return here if he has worsening symptoms or worsening signs of infection.  Rolan Bucco, MD 05/24/13 1052

## 2013-06-19 DIAGNOSIS — H2510 Age-related nuclear cataract, unspecified eye: Secondary | ICD-10-CM | POA: Diagnosis not present

## 2013-06-19 DIAGNOSIS — H35039 Hypertensive retinopathy, unspecified eye: Secondary | ICD-10-CM | POA: Diagnosis not present

## 2013-07-04 DIAGNOSIS — H2510 Age-related nuclear cataract, unspecified eye: Secondary | ICD-10-CM | POA: Diagnosis not present

## 2013-07-04 DIAGNOSIS — H35039 Hypertensive retinopathy, unspecified eye: Secondary | ICD-10-CM | POA: Diagnosis not present

## 2013-07-04 DIAGNOSIS — S0590XA Unspecified injury of unspecified eye and orbit, initial encounter: Secondary | ICD-10-CM | POA: Diagnosis not present

## 2013-08-01 DIAGNOSIS — I10 Essential (primary) hypertension: Secondary | ICD-10-CM | POA: Diagnosis not present

## 2013-08-01 DIAGNOSIS — N529 Male erectile dysfunction, unspecified: Secondary | ICD-10-CM | POA: Diagnosis not present

## 2013-08-01 DIAGNOSIS — R21 Rash and other nonspecific skin eruption: Secondary | ICD-10-CM | POA: Diagnosis not present

## 2013-08-01 DIAGNOSIS — E785 Hyperlipidemia, unspecified: Secondary | ICD-10-CM | POA: Diagnosis not present

## 2013-10-20 ENCOUNTER — Encounter: Payer: Self-pay | Admitting: Interventional Cardiology

## 2013-10-29 ENCOUNTER — Encounter: Payer: Self-pay | Admitting: Interventional Cardiology

## 2013-10-29 ENCOUNTER — Ambulatory Visit (INDEPENDENT_AMBULATORY_CARE_PROVIDER_SITE_OTHER): Payer: Medicare Other | Admitting: Interventional Cardiology

## 2013-10-29 VITALS — BP 138/62 | HR 60 | Ht 66.0 in | Wt 187.8 lb

## 2013-10-29 DIAGNOSIS — I25810 Atherosclerosis of coronary artery bypass graft(s) without angina pectoris: Secondary | ICD-10-CM

## 2013-10-29 DIAGNOSIS — E785 Hyperlipidemia, unspecified: Secondary | ICD-10-CM | POA: Diagnosis not present

## 2013-10-29 DIAGNOSIS — I10 Essential (primary) hypertension: Secondary | ICD-10-CM

## 2013-10-29 NOTE — Patient Instructions (Signed)
Your physician recommends that you continue on your current medications as directed. Please refer to the Current Medication list given to you today.  Your physician wants you to follow-up in: 1 year. You will receive a reminder letter in the mail two months in advance. If you don't receive a letter, please call our office to schedule the follow-up appointment.  

## 2013-10-29 NOTE — Progress Notes (Signed)
[name], male   DOB: October 26, 1930, 79 y.o.   MRN: [id number]    [address] [place], [place]  [address] Phone: [phone] Fax:  [phone]  Date:  10/29/2013   ID:  Sean Kelley, DOB 1930-02-17, MRN [id number]  PCP:  Katy Apo, MD   ASSESSMENT:  1. Coronary artery disease without angina. 2. Hypertension, controlled 3. Hyperlipidemia followed by Dr. Nehemiah Settle  PLAN:  1. Continue active lifestyle 2. Diet control including low salt and low fat 3. Clinical followup in one year   SUBJECTIVE: Sean Kelley is a 79 y.o. male is doing well. He is physically active without limitations. He has not had syncope, palpitations, dyspnea, or chest pain. Is no peripheral edema. No medication side effects. Some erectile dysfunction. Aspirin samples of Cialis but we'll have any. He denies transient neurological symptoms.   Wt Readings from Last 3 Encounters:  10/29/13 187 lb 12.8 oz (85.186 kg)  02/19/12 199 lb 11.8 oz (90.6 kg)  11/15/11 185 lb (83.915 kg)     Past Medical History  Diagnosis Date  . Hypertension   . High cholesterol   . Gastric ulcer   . GERD (gastroesophageal reflux disease)     Current Outpatient Prescriptions  Medication Sig Dispense Refill  . aspirin 325 MG buffered tablet Take 325 mg by mouth daily.        Marland Kitchen lisinopril-hydrochlorothiazide (PRINZIDE,ZESTORETIC) 20-12.5 MG per tablet Take 1 tablet by mouth daily.        . potassium chloride (K-DUR,KLOR-CON) 10 MEQ tablet Take 10 mEq by mouth daily.        . simvastatin (ZOCOR) 20 MG tablet Take 20 mg by mouth at bedtime.         No current facility-administered medications for this visit.    Allergies:    Allergies  Allergen Reactions  . Penicillins Rash    Social History:  The patient  reports that he has never smoked. He does not have any smokeless tobacco history on file. He reports that he does not drink alcohol or use illicit drugs.   ROS:  Please see the history  of present illness.   Occasional chest sting   All other systems reviewed and negative.   OBJECTIVE: VS:  BP 154/78  Pulse 60  Ht 5\' 6"  (1.676 m)  Wt 187 lb 12.8 oz (85.186 kg)  BMI 30.33 kg/m2 Well nourished, well developed, in no acute distress HEENT: normal Neck: JVD flat. Carotid bruit absent  Cardiac:  normal S1, S2; RRR; no murmur Lungs:  clear to auscultation bilaterally, no wheezing, rhonchi or rales Abd: soft, nontender, no hepatomegaly Ext: Edema absent. Pulses  Trace to 1+ Skin: warm and dry Neuro:  CNs 2-12 intact, no focal abnormalities noted  EKG:  NSR with old Anterior MI       Signed, Darci Needle III, MD 10/29/2013 11:15 AM

## 2014-03-25 DIAGNOSIS — Z0000 Encounter for general adult medical examination without abnormal findings: Secondary | ICD-10-CM | POA: Diagnosis not present

## 2014-03-25 DIAGNOSIS — I10 Essential (primary) hypertension: Secondary | ICD-10-CM | POA: Diagnosis not present

## 2014-03-25 DIAGNOSIS — N529 Male erectile dysfunction, unspecified: Secondary | ICD-10-CM | POA: Diagnosis not present

## 2014-03-25 DIAGNOSIS — E785 Hyperlipidemia, unspecified: Secondary | ICD-10-CM | POA: Diagnosis not present

## 2014-06-04 ENCOUNTER — Encounter (HOSPITAL_BASED_OUTPATIENT_CLINIC_OR_DEPARTMENT_OTHER): Payer: Self-pay | Admitting: Emergency Medicine

## 2014-06-04 ENCOUNTER — Other Ambulatory Visit: Payer: Self-pay

## 2014-06-04 ENCOUNTER — Emergency Department (HOSPITAL_BASED_OUTPATIENT_CLINIC_OR_DEPARTMENT_OTHER)
Admission: EM | Admit: 2014-06-04 | Discharge: 2014-06-04 | Disposition: A | Discharge status: Home / Self Care | Payer: Medicare Other | Attending: Emergency Medicine | Admitting: Emergency Medicine

## 2014-06-04 ENCOUNTER — Emergency Department (HOSPITAL_BASED_OUTPATIENT_CLINIC_OR_DEPARTMENT_OTHER): Payer: Medicare Other

## 2014-06-04 DIAGNOSIS — Z951 Presence of aortocoronary bypass graft: Secondary | ICD-10-CM | POA: Diagnosis not present

## 2014-06-04 DIAGNOSIS — E7800 Pure hypercholesterolemia, unspecified: Secondary | ICD-10-CM | POA: Insufficient documentation

## 2014-06-04 DIAGNOSIS — Z7982 Long term (current) use of aspirin: Secondary | ICD-10-CM | POA: Diagnosis not present

## 2014-06-04 DIAGNOSIS — Z8719 Personal history of other diseases of the digestive system: Secondary | ICD-10-CM | POA: Diagnosis not present

## 2014-06-04 DIAGNOSIS — R079 Chest pain, unspecified: Secondary | ICD-10-CM | POA: Insufficient documentation

## 2014-06-04 DIAGNOSIS — Z880 Allergy status to penicillin: Secondary | ICD-10-CM | POA: Diagnosis not present

## 2014-06-04 DIAGNOSIS — R0602 Shortness of breath: Secondary | ICD-10-CM | POA: Insufficient documentation

## 2014-06-04 DIAGNOSIS — R61 Generalized hyperhidrosis: Secondary | ICD-10-CM | POA: Insufficient documentation

## 2014-06-04 DIAGNOSIS — Z79899 Other long term (current) drug therapy: Secondary | ICD-10-CM | POA: Diagnosis not present

## 2014-06-04 DIAGNOSIS — I10 Essential (primary) hypertension: Secondary | ICD-10-CM | POA: Insufficient documentation

## 2014-06-04 LAB — CBC WITH DIFFERENTIAL/PLATELET
Basophils Absolute: 0 10*3/uL (ref 0.0–0.1)
Basophils Relative: 1 % (ref 0–1)
EOS PCT: 6 % — AB (ref 0–5)
Eosinophils Absolute: 0.3 10*3/uL (ref 0.0–0.7)
HEMATOCRIT: 41.2 % (ref 39.0–52.0)
HEMOGLOBIN: 13.9 g/dL (ref 13.0–17.0)
LYMPHS PCT: 47 % — AB (ref 12–46)
Lymphs Abs: 2.3 10*3/uL (ref 0.7–4.0)
MCH: 33.3 pg (ref 26.0–34.0)
MCHC: 33.7 g/dL (ref 30.0–36.0)
MCV: 98.6 fL (ref 78.0–100.0)
MONO ABS: 0.8 10*3/uL (ref 0.1–1.0)
MONOS PCT: 15 % — AB (ref 3–12)
NEUTROS ABS: 1.6 10*3/uL — AB (ref 1.7–7.7)
Neutrophils Relative %: 31 % — ABNORMAL LOW (ref 43–77)
Platelets: 213 10*3/uL (ref 150–400)
RBC: 4.18 MIL/uL — ABNORMAL LOW (ref 4.22–5.81)
RDW: 13.6 % (ref 11.5–15.5)
WBC: 4.9 10*3/uL (ref 4.0–10.5)

## 2014-06-04 LAB — COMPREHENSIVE METABOLIC PANEL
ALBUMIN: 4.1 g/dL (ref 3.5–5.2)
ALK PHOS: 84 U/L (ref 39–117)
ALT: 21 U/L (ref 0–53)
ANION GAP: 12 (ref 5–15)
AST: 21 U/L (ref 0–37)
BILIRUBIN TOTAL: 0.2 mg/dL — AB (ref 0.3–1.2)
BUN: 15 mg/dL (ref 6–23)
CHLORIDE: 100 meq/L (ref 96–112)
CO2: 27 mEq/L (ref 19–32)
Calcium: 9.7 mg/dL (ref 8.4–10.5)
Creatinine, Ser: 1.1 mg/dL (ref 0.50–1.35)
GFR calc Af Amer: 70 mL/min — ABNORMAL LOW (ref >90)
GFR calc non Af Amer: 61 mL/min — ABNORMAL LOW (ref >90)
Glucose, Bld: 103 mg/dL — ABNORMAL HIGH (ref 70–99)
POTASSIUM: 4.4 meq/L (ref 3.7–5.3)
Sodium: 139 mEq/L (ref 137–147)
TOTAL PROTEIN: 7.4 g/dL (ref 6.0–8.3)

## 2014-06-04 LAB — PRO B NATRIURETIC PEPTIDE: PRO B NATRI PEPTIDE: 54.3 pg/mL (ref 0–450)

## 2014-06-04 LAB — TROPONIN I: Troponin I: <0.3 ng/mL (ref <0.30)

## 2014-06-04 MED ORDER — ASPIRIN 81 MG PO CHEW
162.0000 mg | CHEWABLE_TABLET | Freq: Once | ORAL | Status: AC
  Administered 2014-06-04: 162 mg via ORAL
  Filled 2014-06-04: qty 2

## 2014-06-04 MED ORDER — SODIUM CHLORIDE 0.9 % IV SOLN
INTRAVENOUS | Status: DC
  Administered 2014-06-04: 14:00:00 via INTRAVENOUS

## 2014-06-04 NOTE — Discharge Instructions (Signed)
Workup today negative. Negative appointment with your cardiologist for followup in the next few weeks. Also make an appointment to record Dr. Return for any recurrent chest pain lasting 15 or 20 minutes or longer.

## 2014-06-04 NOTE — ED Notes (Signed)
Patient transported to X-ray 

## 2014-06-04 NOTE — ED Provider Notes (Signed)
CSN: [id number]     Arrival date & time 06/04/14  1243 History   First MD Initiated Contact with Patient 06/04/14 1253     Chief Complaint  Patient presents with  . Chest Pain     (Consider location/radiation/quality/duration/timing/severity/associated sxs/prior Treatment) Patient is a 80 y.o. male presenting with chest pain. The history is provided by the patient.  Chest Pain Associated symptoms: diaphoresis and shortness of breath   Associated symptoms: no abdominal pain, no back pain, no fever, no headache, no nausea and not vomiting    patient with onset of chest pain this morning at 4 in the morning. May have lasted at most 30 minutes. Patient has been chest pain-free since then. Pain was on both sides of the chest. Described as 6/10. Patient did take 2 aspirin at home. Associated with shortness of breath. No nausea no vomiting. On Sunday evening patient had diaphoretic episodes with a blood pressure of 85/54. Since then blood pressure has been normal. Patient has a history of coronary artery disease had bypass surgery 4 years ago. Followed by Verdis PrimeHenry Smith cardiology.  Past Medical History  Diagnosis Date  . Hypertension   . High cholesterol   . Gastric ulcer   . GERD (gastroesophageal reflux disease)    Past Surgical History  Procedure Laterality Date  . Coronary artery bypass graft    . Gastric ulcer surgery     Family History  Problem Relation Age of Onset  . Heart failure Father    History  Substance Use Topics  . Smoking status: Never Smoker   . Smokeless tobacco: Not on file  . Alcohol Use: No    Review of Systems  Constitutional: Positive for diaphoresis. Negative for fever.  HENT: Negative for congestion.   Eyes: Negative for visual disturbance.  Respiratory: Positive for shortness of breath.   Cardiovascular: Positive for chest pain.  Gastrointestinal: Negative for nausea, vomiting, abdominal pain and diarrhea.  Genitourinary: Negative for dysuria.   Musculoskeletal: Negative for back pain.  Skin: Negative for rash.  Neurological: Negative for headaches.  Hematological: Does not bruise/bleed easily.  Psychiatric/Behavioral: Negative for confusion.      Allergies  Penicillins  Home Medications   Prior to Admission medications   Medication Sig Start Date End Date Taking? Authorizing Provider  aspirin 325 MG buffered tablet Take 325 mg by mouth daily.     Yes Historical Provider, MD  lisinopril-hydrochlorothiazide (PRINZIDE,ZESTORETIC) 20-12.5 MG per tablet Take 1 tablet by mouth daily.      Historical Provider, MD  potassium chloride (K-DUR,KLOR-CON) 10 MEQ tablet Take 10 mEq by mouth daily.      Historical Provider, MD  simvastatin (ZOCOR) 20 MG tablet Take 20 mg by mouth at bedtime.      Historical Provider, MD   BP 135/70  Pulse 73  Temp(Src) 98.6 F (37 C) (Oral)  Resp 18  Ht 5\' 6"  (1.676 m)  Wt 185 lb (83.915 kg)  BMI 29.87 kg/m2  SpO2 96% Physical Exam  Nursing note and vitals reviewed. Constitutional: He is oriented to person, place, and time. He appears well-developed and well-nourished. No distress.  HENT:  Head: Normocephalic and atraumatic.  Mouth/Throat: Oropharynx is clear and moist.  Eyes: Conjunctivae and EOM are normal. Pupils are equal, round, and reactive to light.  Neck: Normal range of motion.  Cardiovascular: Normal rate and normal heart sounds.   No murmur heard. Pulmonary/Chest: Effort normal and breath sounds normal. No respiratory distress.  Abdominal: Soft. Bowel sounds are  normal. There is no tenderness.  Musculoskeletal: Normal range of motion. He exhibits no edema.  Neurological: He is alert and oriented to person, place, and time. No cranial nerve deficit. He exhibits normal muscle tone. Coordination normal.  Skin: Skin is warm. No rash noted.    ED Course  Procedures (including critical care time) Labs Review Labs Reviewed  COMPREHENSIVE METABOLIC PANEL - Abnormal; Notable for the  following:    Glucose, Bld 103 (*)    Total Bilirubin 0.2 (*)    GFR calc non Af Amer 61 (*)    GFR calc Af Amer 70 (*)    All other components within normal limits  CBC WITH DIFFERENTIAL - Abnormal; Notable for the following:    RBC 4.18 (*)    Neutrophils Relative % 31 (*)    Neutro Abs 1.6 (*)    Lymphocytes Relative 47 (*)    Monocytes Relative 15 (*)    Eosinophils Relative 6 (*)    All other components within normal limits  TROPONIN I  PRO B NATRIURETIC PEPTIDE    Imaging Review Dg Chest 2 View  06/04/2014   CLINICAL DATA:  Chest pain, hypotension, shortness of breath with exertion.  EXAM: CHEST  2 VIEW  COMPARISON:  02/19/2012  FINDINGS: Prior CABG. Stable elevation of the right hemidiaphragm. No confluent airspace opacities or effusions. No acute bony abnormality.  IMPRESSION: No active cardiopulmonary disease.   Electronically Signed   By: Charlett NoseKevin  Dover M.D.   On: 06/04/2014 14:04     EKG Interpretation   Date/Time:  Tuesday June 04 2014 12:57:03 EDT Ventricular Rate:  72 PR Interval:  180 QRS Duration: 86 QT Interval:  392 QTC Calculation: 429 R Axis:   -63 Text Interpretation:  Normal sinus rhythm Possible Left atrial enlargement  Left axis deviation Anterior infarct , age undetermined Abnormal ECG No  significant change since last tracing Confirmed by Aleese Kamps  MD, Sharonlee Nine  [phone]([id number]) on 06/04/2014 1:53:50 PM      MDM   Final diagnoses:  Chest pain, unspecified chest pain type    Patient with chest pain this morning at 4 in the morning. Lasted maybe at most 30 minutes maybe less patience exactly sure. Patient has been no recurrent chest pain since then. Patient's troponin was done 6 hours after alleviation of the chest pain. Was negative. The patient can followup with his cardiologist. Patient also had a spell of some diaphoresis and low blood pressure briefly on Sunday night blood pressure went down to 85/54. Patient states blood pressures been fine since then. No  lightheadedness or dizziness. Patient does have a history of coronary disease and had a CABG approximately 4 years ago. Patient is followed by cardiology.    Vanetta MuldersScott Mirabella Hilario, MD 06/04/14 1515

## 2014-06-04 NOTE — ED Notes (Signed)
Awakened Sunday night diaphoretic, checked his BP 85/54 and states BP "stabilized" midday yesterday.  Developed midsternal chest tightness 0500am lasting 30 minutes and describes a "choking" sensation, nonradiating.  Has been SOB on exertion x 2-3 weeks and unable to ride his bike. Also reports loose stool.

## 2014-06-11 ENCOUNTER — Encounter: Payer: Self-pay | Admitting: Interventional Cardiology

## 2014-06-11 ENCOUNTER — Ambulatory Visit (INDEPENDENT_AMBULATORY_CARE_PROVIDER_SITE_OTHER): Payer: Medicare Other | Admitting: Interventional Cardiology

## 2014-06-11 VITALS — BP 130/58 | HR 60 | Ht 67.0 in | Wt 187.0 lb

## 2014-06-11 DIAGNOSIS — I25810 Atherosclerosis of coronary artery bypass graft(s) without angina pectoris: Secondary | ICD-10-CM | POA: Diagnosis not present

## 2014-06-11 DIAGNOSIS — R072 Precordial pain: Secondary | ICD-10-CM | POA: Diagnosis not present

## 2014-06-11 DIAGNOSIS — I10 Essential (primary) hypertension: Secondary | ICD-10-CM | POA: Diagnosis not present

## 2014-06-11 DIAGNOSIS — E785 Hyperlipidemia, unspecified: Secondary | ICD-10-CM

## 2014-06-11 DIAGNOSIS — R079 Chest pain, unspecified: Secondary | ICD-10-CM | POA: Insufficient documentation

## 2014-06-11 HISTORY — DX: Chest pain, unspecified: R07.9

## 2014-06-11 NOTE — Patient Instructions (Signed)
Your physician has requested that you have an exercise tolerance test. For further information please visit [email or web address]. Please also follow instruction sheet, as given.  Your physician wants you to follow-up in: December of 2015 with Dr. Ival BibleSmtih. You will receive a reminder letter in the mail two months in advance. If you don't receive a letter, please call our office to schedule the follow-up appointment.

## 2014-06-11 NOTE — Progress Notes (Signed)
[name], male   DOB: 12/21/1929, 80 y.o.   MRN: [id number]    [address] [place], [place]  [id number] Phone: [phone]) [phone] Fax:  [phone]) [phone]  Date:  06/11/2014   ID:  Sean Kelley, DOB 03/30/1930, MRN [id number]  PCP:  Katy ApoPOLITE,RONALD D, MD   ASSESSMENT:  1. Awakened with chest pain approximately one week ago and went to the emergency room. He was discharged from the emergency room with no evidence of myocardial ischemia or infarction 2. Coronary artery bypass grafting, 2009 3. Hyperlipidemia 4. Hypertension 5. Right arm pain, likely secondary to spontaneous biceps hemorrhage  PLAN:  1. exercise treadmill test   SUBJECTIVE: Sean Kelley is a 80 y.o. male who awakened from sleep approximately 5 days ago with pressure in his chest. There is some dyspnea. [facility] [place]. Cardiac markers and a repeat EKGs were performed. He did not have evidence of ischemia or infarction. He denies recurrence of chest discomfort since that time. One day ago he was stretching in bed and suddenly developed pain in the right biceps. There is still a small swelling and tenderness to palpation.   Wt Readings from Last 3 Encounters:  06/11/14 187 lb (84.823 kg)  06/04/14 185 lb (83.915 kg)  10/29/13 187 lb 12.8 oz (85.186 kg)     Past Medical History  Diagnosis Date  . Hypertension   . High cholesterol   . Gastric ulcer   . GERD (gastroesophageal reflux disease)     Current Outpatient Prescriptions  Medication Sig Dispense Refill  . aspirin 325 MG buffered tablet Take 325 mg by mouth daily.        Marland Kitchen. lisinopril-hydrochlorothiazide (PRINZIDE,ZESTORETIC) 20-12.5 MG per tablet Take 1 tablet by mouth daily.        . potassium chloride (K-DUR,KLOR-CON) 10 MEQ tablet Take 10 mEq by mouth daily.        . simvastatin (ZOCOR) 20 MG tablet Take 20 mg by mouth at bedtime.        . tamsulosin (FLOMAX) 0.4 MG CAPS capsule 0.4 mg.        No current facility-administered medications for this visit.    Allergies:    Allergies  Allergen Reactions  . Penicillins Rash    Social History:  The patient  reports that he has never smoked. He does not have any smokeless tobacco history on file. He reports that he does not drink alcohol or use illicit drugs.   ROS:  Please see the history of present illness.   No edema. No exertional intolerance.   All other systems reviewed and negative.   OBJECTIVE: VS:  BP 130/58  Pulse 60  Ht 5\' 7"  (1.702 m)  Wt 187 lb (84.823 kg)  BMI 29.28 kg/m2 Well nourished, well developed, in no acute distress, younger than stated age HEENT: normal Neck: JVD flat. Carotid bruit absent  Cardiac:  normal S1, S2; RRR; no murmur Lungs:  clear to auscultation bilaterally, no wheezing, rhonchi or rales Abd: soft, nontender, no hepatomegaly Ext: Edema absent. Pulses 2+ Skin: warm and dry Neuro:  CNs 2-12 intact, no focal abnormalities noted  EKG:  Poor R-wave progression no acute change when compared to prior tracings       Signed, Darci NeedleHenry W. B. Kevork Joyce III, MD 06/11/2014 12:44 PM

## 2014-06-13 ENCOUNTER — Ambulatory Visit (HOSPITAL_COMMUNITY)
Admission: RE | Admit: 2014-06-13 | Discharge: 2014-06-13 | Disposition: A | Discharge status: Home / Self Care | Payer: Medicare Other | Source: Ambulatory Visit | Attending: Interventional Cardiology | Admitting: Interventional Cardiology

## 2014-06-13 DIAGNOSIS — I25810 Atherosclerosis of coronary artery bypass graft(s) without angina pectoris: Secondary | ICD-10-CM

## 2014-06-13 DIAGNOSIS — I10 Essential (primary) hypertension: Secondary | ICD-10-CM | POA: Insufficient documentation

## 2014-06-13 NOTE — Procedures (Signed)
Exercise Treadmill Test  Test  Exercise Tolerance Test Ordering MD: Verdis PrimeHenry Smith, MD  Interpreting MD:   Unique Test No:1   Treadmill:  1  Indication for ETT: chest pain - rule out ischemia  Contraindication to ETT: Yes   Stress Modality: exercise - treadmill  Cardiac Imaging Performed: non   Protocol: standard Bruce - maximal  Max BP:  200/66  Max MPHR (bpm):  138 85% MPR (bpm):  117  MPHR obtained (bpm): 141 % MPHR obtained:  102  Reached 85% MPHR (min:sec):  5:29 Total Exercise Time (min-sec):  7:18  Workload in METS:  8.90 Borg Scale:   Reason ETT Terminated:  fatigue    ST Segment Analysis At Rest: NSR, RAD, anterior MI, lateral TWI With Exercise: no evidence of significant ST depression  Other Information Arrhythmia:  No Angina during ETT:  absent (0) Quality of ETT:  diagnostic  ETT Interpretation:  normal - no evidence of ischemia by ST analysis  Comments: ETT with fair exercise tolerance; no chest pain; normal BP response; no ST changes; negative adequate ETT.  Olga MillersBrian Abrahan Fulmore

## 2014-09-23 DIAGNOSIS — I10 Essential (primary) hypertension: Secondary | ICD-10-CM | POA: Diagnosis not present

## 2014-09-23 DIAGNOSIS — Z23 Encounter for immunization: Secondary | ICD-10-CM | POA: Diagnosis not present

## 2014-09-23 DIAGNOSIS — I2510 Atherosclerotic heart disease of native coronary artery without angina pectoris: Secondary | ICD-10-CM | POA: Diagnosis not present

## 2014-09-23 DIAGNOSIS — N529 Male erectile dysfunction, unspecified: Secondary | ICD-10-CM | POA: Diagnosis not present

## 2014-09-23 DIAGNOSIS — E785 Hyperlipidemia, unspecified: Secondary | ICD-10-CM | POA: Diagnosis not present

## 2015-07-15 DIAGNOSIS — I2510 Atherosclerotic heart disease of native coronary artery without angina pectoris: Secondary | ICD-10-CM | POA: Diagnosis not present

## 2015-07-15 DIAGNOSIS — Z0000 Encounter for general adult medical examination without abnormal findings: Secondary | ICD-10-CM | POA: Diagnosis not present

## 2015-07-15 DIAGNOSIS — Z1389 Encounter for screening for other disorder: Secondary | ICD-10-CM | POA: Diagnosis not present

## 2015-07-15 DIAGNOSIS — I10 Essential (primary) hypertension: Secondary | ICD-10-CM | POA: Diagnosis not present

## 2015-07-15 DIAGNOSIS — E785 Hyperlipidemia, unspecified: Secondary | ICD-10-CM | POA: Diagnosis not present

## 2016-03-01 ENCOUNTER — Ambulatory Visit (INDEPENDENT_AMBULATORY_CARE_PROVIDER_SITE_OTHER): Payer: Medicare Other | Admitting: Interventional Cardiology

## 2016-03-01 ENCOUNTER — Encounter: Payer: Self-pay | Admitting: Interventional Cardiology

## 2016-03-01 VITALS — BP 146/68 | HR 65 | Ht 67.0 in | Wt 189.0 lb

## 2016-03-01 DIAGNOSIS — I10 Essential (primary) hypertension: Secondary | ICD-10-CM

## 2016-03-01 DIAGNOSIS — I25810 Atherosclerosis of coronary artery bypass graft(s) without angina pectoris: Secondary | ICD-10-CM | POA: Diagnosis not present

## 2016-03-01 DIAGNOSIS — E785 Hyperlipidemia, unspecified: Secondary | ICD-10-CM

## 2016-03-01 NOTE — Patient Instructions (Signed)

## 2016-03-01 NOTE — Progress Notes (Signed)
Cardiology Office Note   Date:  03/01/2016   ID:  Sean FeelingJohn E Coye, DOB 12/04/1929, MRN [id number]  PCP:  Katy ApoPOLITE,RONALD D, MD  Cardiologist:  Lesleigh NoeSMITH III,HENRY W, MD   Chief Complaint  Patient presents with  . Coronary Artery Disease      History of Present Illness: Sean Kelley is a 82 y.o. male who presents for CAD with prior CABG, hypertension, hyperlipidemia, and history of gastric ulcer disease.  No angina or other cardiac complaints. No episodes of palpitations or syncope. 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    Past Medical History  Diagnosis Date  . Hypertension   . High cholesterol   . Gastric ulcer   . GERD (gastroesophageal reflux disease)     Past Surgical History  Procedure Laterality Date  . Coronary artery bypass graft    . Gastric ulcer surgery       Current Outpatient Prescriptions  Medication Sig Dispense Refill  . aspirin 325 MG buffered tablet Take 325 mg by mouth daily.      Marland Kitchen. lisinopril-hydrochlorothiazide (PRINZIDE,ZESTORETIC) 20-12.5 MG per tablet Take 1 tablet by mouth daily.      . potassium chloride (K-DUR,KLOR-CON) 10 MEQ tablet Take 10 mEq by mouth daily.      . simvastatin (ZOCOR) 20 MG tablet Take 20 mg by mouth at bedtime.      . tamsulosin (FLOMAX) 0.4 MG CAPS capsule Take 0.4 mg by mouth daily.      No current facility-administered medications for this visit.    Allergies:   Penicillins    Social History:  The patient  reports that he has never smoked. He has never used smokeless tobacco. He reports that he does not drink alcohol or use illicit drugs.   Family History:  The patient's family history includes Heart failure in his father.    ROS:  Please see the history of present illness.   Otherwise, review of systems are positive for Shortness of breath, vision disturbance, decreased hearing, leg pain, and diarrhea intermittently.   All other systems are reviewed and negative.     PHYSICAL EXAM: VS:  BP 146/68 mmHg  Pulse 65  Ht 5\' 7"  (1.702 m)  Wt 189 lb (85.73 kg)  BMI 29.59 kg/m2 , BMI Body mass index is 29.59 kg/(m^2). GEN: Well nourished, well developed, in no acute distress HEENT: normal Neck: no JVD, carotid bruits, or masses Cardiac: RRR.  There is no murmur, rub, or gallop. There is no edema. Respiratory:  clear to auscultation bilaterally, normal work of breathing. GI: soft, nontender, nondistended, + BS MS: no deformity or atrophy Skin: warm and dry, no rash Neuro:  Strength and sensation are intact Psych: euthymic mood, full affect   EKG:  EKG is ordered today. The ekg reveals normal sinus rhythm with poor R-wave progression unchanged from prior tracings. Left axis deviation.   Recent Labs: No results found for requested labs within last 365 days.    Lipid Panel    Component Value Date/Time   CHOL 134 02/20/2012 0605   TRIG 82 02/20/2012 0605   HDL 40 02/20/2012 0605   CHOLHDL 3.4 02/20/2012 0605   VLDL 16 02/20/2012 0605   LDLCALC 78 02/20/2012 0605      Wt Readings from Last 3 Encounters:  03/01/16 189 lb (85.73 kg)  06/11/14 187 lb (84.823 kg)  06/04/14 185 lb (83.915 kg)      Other studies Reviewed:  Additional studies/ records that were reviewed today include: None. The findings include none.    ASSESSMENT AND PLAN:  1. Coronary artery disease involving coronary bypass graft of native heart without angina pectoris Asymptomatic  2. Essential hypertension Repeat is 138/70. Controlled.  3. HLD (hyperlipidemia) Followed by primary care.    Current medicines are reviewed at length with the patient today.  The patient has the following concerns regarding medicines: None.  The following changes/actions have been instituted:    Continue active lifestyle  Low-salt diet  Decrease aspirin to 81 mg daily  Labs/ tests ordered today include:  No orders of the defined types were placed in this encounter.      Disposition:   FU with HS in 1 year Signed, Lesleigh Noe, MD  03/01/2016 12:04 PM    [facility] [address] [place], [place], [place]  [address] Phone: [phone]; Fax: [phone]

## 2016-06-22 DIAGNOSIS — T63444A Toxic effect of venom of bees, undetermined, initial encounter: Secondary | ICD-10-CM | POA: Diagnosis not present

## 2016-07-20 DIAGNOSIS — I2510 Atherosclerotic heart disease of native coronary artery without angina pectoris: Secondary | ICD-10-CM | POA: Diagnosis not present

## 2016-07-20 DIAGNOSIS — E7800 Pure hypercholesterolemia, unspecified: Secondary | ICD-10-CM | POA: Diagnosis not present

## 2016-07-20 DIAGNOSIS — I10 Essential (primary) hypertension: Secondary | ICD-10-CM | POA: Diagnosis not present

## 2016-07-20 DIAGNOSIS — N529 Male erectile dysfunction, unspecified: Secondary | ICD-10-CM | POA: Diagnosis not present

## 2016-07-20 DIAGNOSIS — Z0000 Encounter for general adult medical examination without abnormal findings: Secondary | ICD-10-CM | POA: Diagnosis not present

## 2016-07-20 DIAGNOSIS — Z1389 Encounter for screening for other disorder: Secondary | ICD-10-CM | POA: Diagnosis not present

## 2016-10-15 DIAGNOSIS — H31011 Macula scars of posterior pole (postinflammatory) (post-traumatic), right eye: Secondary | ICD-10-CM | POA: Diagnosis not present

## 2016-11-12 DIAGNOSIS — H25811 Combined forms of age-related cataract, right eye: Secondary | ICD-10-CM | POA: Diagnosis not present

## 2016-11-12 DIAGNOSIS — H25812 Combined forms of age-related cataract, left eye: Secondary | ICD-10-CM | POA: Diagnosis not present

## 2016-11-12 DIAGNOSIS — H5213 Myopia, bilateral: Secondary | ICD-10-CM | POA: Diagnosis not present

## 2016-12-20 DIAGNOSIS — H02831 Dermatochalasis of right upper eyelid: Secondary | ICD-10-CM | POA: Diagnosis not present

## 2017-02-14 DIAGNOSIS — I10 Essential (primary) hypertension: Secondary | ICD-10-CM | POA: Diagnosis not present

## 2017-02-14 DIAGNOSIS — I2510 Atherosclerotic heart disease of native coronary artery without angina pectoris: Secondary | ICD-10-CM | POA: Diagnosis not present

## 2017-02-14 DIAGNOSIS — R5383 Other fatigue: Secondary | ICD-10-CM | POA: Diagnosis not present

## 2017-02-14 DIAGNOSIS — E7800 Pure hypercholesterolemia, unspecified: Secondary | ICD-10-CM | POA: Diagnosis not present

## 2017-03-08 ENCOUNTER — Encounter: Payer: Self-pay | Admitting: Interventional Cardiology

## 2017-03-23 NOTE — Progress Notes (Signed)
Cardiology Office Note    Date:  03/24/2017   ID:  Sean Kelley, DOB [birth year], MRN [id number]  PCP:  Katy Apo, MD  Cardiologist: Lesleigh Noe, MD   Chief Complaint  Patient presents with  . Coronary Artery Disease    History of Present Illness:  Sean Kelley is a 83 y.o. male who presents for CAD with prior CABG, hypertension, hyperlipidemia, and history of gastric ulcer disease.  This is Sean Kelley has occasional stinging discomfort left upper chest in the midclavicular line. This occurs when he is walking. It lasts seconds and resolves. He denies orthopnea, PND, chest pressure, angina.  Past Medical History:  Diagnosis Date  . Gastric ulcer   . GERD (gastroesophageal reflux disease)   . High cholesterol   . Hypertension     Past Surgical History:  Procedure Laterality Date  . CORONARY ARTERY BYPASS GRAFT    . gastric ulcer surgery      Current Medications: Outpatient Medications Prior to Visit  Medication Sig Dispense Refill  . aspirin 325 MG buffered tablet Take 325 mg by mouth daily.      Marland Kitchen lisinopril-hydrochlorothiazide (PRINZIDE,ZESTORETIC) 20-12.5 MG per tablet Take 1 tablet by mouth daily.      . potassium chloride (K-DUR,KLOR-CON) 10 MEQ tablet Take 10 mEq by mouth daily.      . simvastatin (ZOCOR) 20 MG tablet Take 20 mg by mouth at bedtime.      . tamsulosin (FLOMAX) 0.4 MG CAPS capsule Take 0.4 mg by mouth daily.      No facility-administered medications prior to visit.      Allergies:   Penicillins   Social History   Social History  . Marital status: Widowed    Spouse name: N/A  . Number of children: N/A  . Years of education: N/A   Social History Main Topics  . Smoking status: Never Smoker  . Smokeless tobacco: Never Used  . Alcohol use No  . Drug use: No  . Sexual activity: Not Asked   Other Topics Concern  . None   Social History Narrative  . None     Family History:  The patient's family history includes Heart  failure in his father.   ROS:   Please see the history of present illness.    Hearing loss, PND, chest discomfort.  All other systems reviewed and are negative.   PHYSICAL EXAM:   VS:  BP (!) 124/56 (BP Location: Left Arm)   Pulse 84   Ht  (1.702 m)   Wt 190 lb (86.2 kg)   BMI 29.76 kg/m    GEN: Well nourished, well developed, in no acute distress  HEENT: normal  Neck: no JVD, carotid bruits, or masses Cardiac: RRR; no murmurs, rubs, or gallops,no edema  Respiratory:  clear to auscultation bilaterally, normal work of breathing GI: soft, nontender, nondistended, + BS MS: no deformity or atrophy  Skin: warm and dry, no rash Neuro:  Alert and Oriented x 3, Strength and sensation are intact Psych: euthymic mood, full affect  Wt Readings from Last 3 Encounters:  03/24/17 190 lb (86.2 kg)  03/01/16 189 lb (85.7 kg)  06/11/14 187 lb (84.8 kg)      Studies/Labs Reviewed:   EKG:  EKG  Normal sinus rhythm, left axis deviation, poor R-wave progression.  Recent Labs: No results found for requested labs within last 8760 hours.   Lipid Panel    Component Value Date/Time   CHOL 134  02/20/2012 0605   TRIG 82 02/20/2012 0605   HDL 40 02/20/2012 0605   CHOLHDL 3.4 02/20/2012 0605   VLDL 16 02/20/2012 0605   LDLCALC 78 02/20/2012 0605    Additional studies/ records that were reviewed today include:  No new functional data    ASSESSMENT:    1. Coronary artery disease involving coronary bypass graft of native heart without angina pectoris   2. Essential hypertension   3. Other hyperlipidemia      PLAN:  In order of problems listed above:  1. Stable without angina pectoris. No functional testing is needed. 2. Excellent blood pressure control. Target 43/81 year old lasts. 3. LDL target less than 70.  Recommending aerobic activity, weight control, 2 g sodium diet, and plan based I. Local follow-up 1 year.    Medication Adjustments/Labs and Tests  Ordered: Current medicines are reviewed at length with the patient today.  Concerns regarding medicines are outlined above.  Medication changes, Labs and Tests ordered today are listed in the Patient Instructions below. There are no Patient Instructions on file for this visit.   Signed, Lesleigh Noe, MD  03/24/2017 11:19 AM    [facility] [address] [place], [place], [place]  [address] Phone: [phone]; Fax: [phone]

## 2017-03-24 ENCOUNTER — Encounter (INDEPENDENT_AMBULATORY_CARE_PROVIDER_SITE_OTHER): Payer: Self-pay

## 2017-03-24 ENCOUNTER — Ambulatory Visit (INDEPENDENT_AMBULATORY_CARE_PROVIDER_SITE_OTHER): Payer: Medicare Other | Admitting: Interventional Cardiology

## 2017-03-24 ENCOUNTER — Encounter: Payer: Self-pay | Admitting: Interventional Cardiology

## 2017-03-24 VITALS — BP 124/56 | HR 84 | Ht 67.0 in | Wt 190.0 lb

## 2017-03-24 DIAGNOSIS — E784 Other hyperlipidemia: Secondary | ICD-10-CM

## 2017-03-24 DIAGNOSIS — I10 Essential (primary) hypertension: Secondary | ICD-10-CM

## 2017-03-24 DIAGNOSIS — I25810 Atherosclerosis of coronary artery bypass graft(s) without angina pectoris: Secondary | ICD-10-CM

## 2017-03-24 DIAGNOSIS — E7849 Other hyperlipidemia: Secondary | ICD-10-CM

## 2017-03-24 NOTE — Patient Instructions (Signed)

## 2017-08-03 DIAGNOSIS — E119 Type 2 diabetes mellitus without complications: Secondary | ICD-10-CM | POA: Diagnosis not present

## 2017-08-03 DIAGNOSIS — E7800 Pure hypercholesterolemia, unspecified: Secondary | ICD-10-CM | POA: Diagnosis not present

## 2017-08-03 DIAGNOSIS — I2510 Atherosclerotic heart disease of native coronary artery without angina pectoris: Secondary | ICD-10-CM | POA: Diagnosis not present

## 2017-08-03 DIAGNOSIS — R7301 Impaired fasting glucose: Secondary | ICD-10-CM | POA: Diagnosis not present

## 2017-08-03 DIAGNOSIS — Z1389 Encounter for screening for other disorder: Secondary | ICD-10-CM | POA: Diagnosis not present

## 2017-08-03 DIAGNOSIS — I10 Essential (primary) hypertension: Secondary | ICD-10-CM | POA: Diagnosis not present

## 2017-08-03 DIAGNOSIS — Z0000 Encounter for general adult medical examination without abnormal findings: Secondary | ICD-10-CM | POA: Diagnosis not present

## 2017-11-08 DIAGNOSIS — H02403 Unspecified ptosis of bilateral eyelids: Secondary | ICD-10-CM | POA: Diagnosis not present

## 2017-11-08 DIAGNOSIS — H40001 Preglaucoma, unspecified, right eye: Secondary | ICD-10-CM | POA: Diagnosis not present

## 2017-11-08 DIAGNOSIS — H35033 Hypertensive retinopathy, bilateral: Secondary | ICD-10-CM | POA: Diagnosis not present

## 2017-11-08 DIAGNOSIS — H35443 Age-related reticular degeneration of retina, bilateral: Secondary | ICD-10-CM | POA: Diagnosis not present

## 2017-11-08 DIAGNOSIS — H35031 Hypertensive retinopathy, right eye: Secondary | ICD-10-CM | POA: Diagnosis not present

## 2017-11-08 DIAGNOSIS — H35461 Secondary vitreoretinal degeneration, right eye: Secondary | ICD-10-CM | POA: Diagnosis not present

## 2017-11-08 DIAGNOSIS — H40002 Preglaucoma, unspecified, left eye: Secondary | ICD-10-CM | POA: Diagnosis not present

## 2017-11-08 DIAGNOSIS — H35463 Secondary vitreoretinal degeneration, bilateral: Secondary | ICD-10-CM | POA: Diagnosis not present

## 2017-11-08 DIAGNOSIS — I10 Essential (primary) hypertension: Secondary | ICD-10-CM | POA: Diagnosis not present

## 2017-11-08 DIAGNOSIS — H3540 Unspecified peripheral retinal degeneration: Secondary | ICD-10-CM | POA: Diagnosis not present

## 2017-11-08 DIAGNOSIS — H5700 Unspecified anomaly of pupillary function: Secondary | ICD-10-CM | POA: Diagnosis not present

## 2017-11-08 DIAGNOSIS — H524 Presbyopia: Secondary | ICD-10-CM | POA: Diagnosis not present

## 2017-12-21 DIAGNOSIS — E119 Type 2 diabetes mellitus without complications: Secondary | ICD-10-CM | POA: Diagnosis not present

## 2018-02-17 DIAGNOSIS — H5700 Unspecified anomaly of pupillary function: Secondary | ICD-10-CM | POA: Diagnosis not present

## 2018-02-17 DIAGNOSIS — H04123 Dry eye syndrome of bilateral lacrimal glands: Secondary | ICD-10-CM | POA: Diagnosis not present

## 2018-02-17 DIAGNOSIS — H40023 Open angle with borderline findings, high risk, bilateral: Secondary | ICD-10-CM | POA: Diagnosis not present

## 2018-02-17 DIAGNOSIS — H16223 Keratoconjunctivitis sicca, not specified as Sjogren's, bilateral: Secondary | ICD-10-CM | POA: Diagnosis not present

## 2018-02-17 DIAGNOSIS — H18413 Arcus senilis, bilateral: Secondary | ICD-10-CM | POA: Diagnosis not present

## 2018-02-17 DIAGNOSIS — H02403 Unspecified ptosis of bilateral eyelids: Secondary | ICD-10-CM | POA: Diagnosis not present

## 2018-02-17 DIAGNOSIS — H1851 Endothelial corneal dystrophy: Secondary | ICD-10-CM | POA: Diagnosis not present

## 2018-02-20 DIAGNOSIS — I2510 Atherosclerotic heart disease of native coronary artery without angina pectoris: Secondary | ICD-10-CM | POA: Diagnosis not present

## 2018-02-20 DIAGNOSIS — E7800 Pure hypercholesterolemia, unspecified: Secondary | ICD-10-CM | POA: Diagnosis not present

## 2018-02-20 DIAGNOSIS — R0600 Dyspnea, unspecified: Secondary | ICD-10-CM | POA: Diagnosis not present

## 2018-02-20 DIAGNOSIS — E119 Type 2 diabetes mellitus without complications: Secondary | ICD-10-CM | POA: Diagnosis not present

## 2018-02-20 DIAGNOSIS — I10 Essential (primary) hypertension: Secondary | ICD-10-CM | POA: Diagnosis not present

## 2018-03-13 ENCOUNTER — Encounter: Payer: Self-pay | Admitting: Cardiology

## 2018-03-13 ENCOUNTER — Telehealth: Payer: Self-pay | Admitting: Interventional Cardiology

## 2018-03-13 DIAGNOSIS — R0600 Dyspnea, unspecified: Secondary | ICD-10-CM | POA: Insufficient documentation

## 2018-03-13 DIAGNOSIS — R0609 Other forms of dyspnea: Secondary | ICD-10-CM

## 2018-03-13 HISTORY — DX: Dyspnea, unspecified: R06.00

## 2018-03-13 HISTORY — DX: Other forms of dyspnea: R06.09

## 2018-03-13 NOTE — Progress Notes (Signed)
Cardiology Office Note:    Date:  03/14/2018   ID:  Sean Kelley, DOB May 25, 1930, MRN [id number]  PCP:  Renford Dills, MD  Cardiologist:  Lesleigh Noe, MD  Referring MD: Renford Dills, MD   Chief Complaint  Patient presents with  . Shortness of Breath    History of Present Illness:    Sean Kelley is a 84 y.o. male with a past medical history significant for CAD with prior CABG 2009, hypertension, hyperlipidemia, and history of gastric ulcer disease.   The patient was last seen in the office on 03/24/17 by Dr. Katrinka Blazing at which time he was doing well with no angina and blood pressure was well controlled.  The patient is here for an acute visit with complaints of dyspnea on exertion.  He has had no recent cardiovascular testing.  Sean Kelley is here today alone for evaluation of dyspnea. He appears younger than stated age. A couple of weeks ago he got up in the morning and walked down the hall and suddenly became short of breath. He had no chest discomfort, diaphoresis, nausea, lightheadedness. The shortness of breath continued for several days and was with activity like walking and climbing stairs. It gradually improved until back to normal. He did have a mild cold with nasal congestion and occ cough. He has had no shortness of breath in the last week.   Today he notes no shortness of breath upon walking into the office. He has had loose BM off and on for the last couple of weeks with about 3 BMs per day  He is usually very active working in his garden and yard without any exertional symptoms. He worked out in his yard last Thursday and Friday, mowing and weed eating without any symptoms. He has had no orthopnea, PND, edema, lightheadedness or syncope.    Past Medical History:  Diagnosis Date  . CAD (coronary artery disease)    Hx CABG  . Gastric ulcer   . GERD (gastroesophageal reflux disease)   . High cholesterol   . Hypertension     Past Surgical History:  Procedure  Laterality Date  . CORONARY ARTERY BYPASS GRAFT    . gastric ulcer surgery      Current Medications: Current Meds  Medication Sig  . aspirin EC 81 MG tablet Take 81 mg by mouth daily.  Marland Kitchen lisinopril-hydrochlorothiazide (PRINZIDE,ZESTORETIC) 20-12.5 MG per tablet Take 1 tablet by mouth daily.    . potassium chloride (K-DUR,KLOR-CON) 10 MEQ tablet Take 10 mEq by mouth daily.    . simvastatin (ZOCOR) 20 MG tablet Take 20 mg by mouth at bedtime.    . tamsulosin (FLOMAX) 0.4 MG CAPS capsule Take 0.4 mg by mouth daily.      Allergies:   Penicillins   Social History   Socioeconomic History  . Marital status: Widowed    Spouse name: Not on file  . Number of children: Not on file  . Years of education: Not on file  . Highest education level: Not on file  Occupational History  . Not on file  Social Needs  . Financial resource strain: Not on file  . Food insecurity:    Worry: Not on file    Inability: Not on file  . Transportation needs:    Medical: Not on file    Non-medical: Not on file  Tobacco Use  . Smoking status: Never Smoker  . Smokeless tobacco: Never Used  Substance and Sexual Activity  . Alcohol  use: No    Alcohol/week: 0.0 oz  . Drug use: No  . Sexual activity: Not on file  Lifestyle  . Physical activity:    Days per week: Not on file    Minutes per session: Not on file  . Stress: Not on file  Relationships  . Social connections:    Talks on phone: Not on file    Gets together: Not on file    Attends religious service: Not on file    Active member of club or organization: Not on file    Attends meetings of clubs or organizations: Not on file    Relationship status: Not on file  Other Topics Concern  . Not on file  Social History Narrative  . Not on file     Family History: The patient's family history includes Heart failure in his father. ROS:   Please see the history of present illness.     All other systems reviewed and are  negative.  EKGs/Labs/Other Studies Reviewed:    The following studies were reviewed today: None available in EPIC  EKG:  EKG is  ordered today.  The ekg ordered today demonstrates NSR with PAC, Mild T wave changes in Leads V1-2.   Recent Labs: No results found for requested labs within last 8760 hours.   Recent Lipid Panel    Component Value Date/Time   CHOL 134 02/20/2012 0605   TRIG 82 02/20/2012 0605   HDL 40 02/20/2012 0605   CHOLHDL 3.4 02/20/2012 0605   VLDL 16 02/20/2012 0605   LDLCALC 78 02/20/2012 0605    Physical Exam:    VS:  BP 140/68   Pulse 67   Ht 5\' 7"  (1.702 m)   Wt 177 lb (80.3 kg)   SpO2 99%   BMI 27.72 kg/m     Wt Readings from Last 3 Encounters:  03/14/18 177 lb (80.3 kg)  03/24/17 190 lb (86.2 kg)  03/01/16 189 lb (85.7 kg)     Physical Exam  Constitutional: He is oriented to person, place, and time. He appears well-developed and well-nourished.  HENT:  Head: Normocephalic and atraumatic.  Neck: Normal range of motion. Neck supple. No JVD present.  Cardiovascular: Normal rate and regular rhythm. Exam reveals no gallop and no friction rub.  Murmur heard.  Systolic murmur is present with a grade of 2/6 at the upper right sternal border. Occasional early beat  Pulmonary/Chest: Effort normal and breath sounds normal. No respiratory distress. He has no wheezes. He has no rales. He exhibits no tenderness.  Abdominal: Soft. Bowel sounds are normal. He exhibits no distension.  Musculoskeletal: Normal range of motion. He exhibits no edema or deformity.  Neurological: He is alert and oriented to person, place, and time.  Skin: Skin is warm and dry.  Psychiatric: He has a normal mood and affect. His behavior is normal. Thought content normal.  Vitals reviewed.    ASSESSMENT:    1. DOE (dyspnea on exertion)   2. Coronary artery disease involving coronary bypass graft of native heart without angina pectoris   3. Essential hypertension   4. Other  hyperlipidemia   5. SOB (shortness of breath)   6. Murmur    PLAN:    In order of problems listed above:  Dyspnea on exertion: Sudden onset with no chest discomfort or associated symptoms. Continued DOE for several days and then resolved with no shortness of breath over the last week. Feels very well today. Subtle EKG changes anteriorly.  With his history of CABG 10 years ago will check Lexiscan myoview to evaluate for for myocardial ischemia. Also check echo for wall motion, LV function and valves.   CAD: Status post past history of CABG.  On aspirin 81 mg, statin, ACE inhibitor. Not on BB, unknown but pt thinks may be due to borderline low BP. No chest pain, but recent new DOE as above, could be anginal equivalent.   Hypertension: Blood pressure elevated here but he reports tat home BPs run 110's-120's. Continue current therapy on lisinopril-hydrochlorothiazide. Recent labs at PCP showed SCr 1.17, K+ 4.4  Hyperlipidemia: Followed by Dr. Nehemiah SettlePolite.  On simvastatin 20 mg daily. Labs at PCP on 02/20/18 showed LDL 73, very near goal of <16<70.   Murmur: 2/6 systolic murmur at RUSB. Will check echo for LV function and valve function.   Medication Adjustments/Labs and Tests Ordered: Current medicines are reviewed at length with the patient today.  Concerns regarding medicines are outlined above. Labs and tests ordered and medication changes are outlined in the patient instructions below:  Patient Instructions  Medication Instructions: Your physician recommends that you continue on your current medications as directed. Please refer to the Current Medication list given to you today.   Labwork: None Ordred  Procedures/Testing: Your physician has requested that you have an echocardiogram. Echocardiography is a painless test that uses sound waves to create images of your heart. It provides your doctor with information about the size and shape of your heart and how well your heart's chambers and valves  are working. This procedure takes approximately one hour. There are no restrictions for this procedure.    Your physician has requested that you have a lexiscan myoview. For further information please visit [email or web address]. Please follow instruction sheet, as given.    Follow-Up: Your physician recommends that you schedule a follow-up appointment in: 6 months with Dr. Katrinka BlazingSmith    Any Additional Special Instructions Will Be Listed Below (If Applicable). Echocardiogram An echocardiogram, or echocardiography, uses sound waves (ultrasound) to produce an image of your heart. The echocardiogram is simple, painless, obtained within a short period of time, and offers valuable information to your health care provider. The images from an echocardiogram can provide information such as:  Evidence of coronary artery disease (CAD).  Heart size.  Heart muscle function.  Heart valve function.  Aneurysm detection.  Evidence of a past heart attack.  Fluid buildup around the heart.  Heart muscle thickening.  Assess heart valve function.  Tell a health care provider about:  Any allergies you have.  All medicines you are taking, including vitamins, herbs, eye drops, creams, and over-the-counter medicines.  Any problems you or family members have had with anesthetic medicines.  Any blood disorders you have.  Any surgeries you have had.  Any medical conditions you have.  Whether you are pregnant or may be pregnant. What happens before the procedure? No special preparation is needed. Eat and drink normally. What happens during the procedure?  In order to produce an image of your heart, gel will be applied to your chest and a wand-like tool (transducer) will be moved over your chest. The gel will help transmit the sound waves from the transducer. The sound waves will harmlessly bounce off your heart to allow the heart images to be captured in real-time motion. These images will then be  recorded.  You may need an IV to receive a medicine that improves the quality of the pictures. What happens after the procedure? You  may return to your normal schedule including diet, activities, and medicines, unless your health care provider tells you otherwise. This information is not intended to replace advice given to you by your health care provider. Make sure you discuss any questions you have with your health care provider. Document Released: 11/12/2000 Document Revised: 07/03/2016 Document Reviewed: 07/23/2013 Elsevier Interactive Patient Education  2017 [employer].      If you need a refill on your cardiac medications before your next appointment, please call your pharmacy.      Signed, Berton Bon, NP  03/14/2018 9:01 AM    [facility]

## 2018-03-13 NOTE — Telephone Encounter (Signed)
Pt states he was seen by PCP recently and spoke with him about having DOE.  Pt also has some pain in the right side of his neck along with the SOB.  SOB is intermittent.  Denies any other cardiac sx.  Scheduled pt to see Sean LeydenNina Hammond, NP tomorrow at [facility]8AM.  Pt verbalized understanding and was in agreement with this plan.

## 2018-03-13 NOTE — Telephone Encounter (Signed)
Left message to call back  

## 2018-03-13 NOTE — Telephone Encounter (Signed)
Pt c/o Shortness Of Breath: STAT if SOB developed within the last 24 hours or pt is noticeably SOB on the phone  1. Are you currently SOB (can you hear that pt is SOB on the phone)? no  2. How long have you been experiencing SOB? Couple of weeks   3. Are you SOB when sitting or when up moving around? Moving around  4. Are you currently experiencing any other symptoms? No other symptoms

## 2018-03-14 ENCOUNTER — Encounter: Payer: Self-pay | Admitting: Cardiology

## 2018-03-14 ENCOUNTER — Ambulatory Visit (INDEPENDENT_AMBULATORY_CARE_PROVIDER_SITE_OTHER): Payer: Medicare Other | Admitting: Cardiology

## 2018-03-14 VITALS — BP 140/68 | HR 67 | Ht 67.0 in | Wt 177.0 lb

## 2018-03-14 DIAGNOSIS — E7849 Other hyperlipidemia: Secondary | ICD-10-CM

## 2018-03-14 DIAGNOSIS — I10 Essential (primary) hypertension: Secondary | ICD-10-CM

## 2018-03-14 DIAGNOSIS — R0609 Other forms of dyspnea: Secondary | ICD-10-CM | POA: Diagnosis not present

## 2018-03-14 DIAGNOSIS — R011 Cardiac murmur, unspecified: Secondary | ICD-10-CM

## 2018-03-14 DIAGNOSIS — I25810 Atherosclerosis of coronary artery bypass graft(s) without angina pectoris: Secondary | ICD-10-CM

## 2018-03-14 DIAGNOSIS — R0602 Shortness of breath: Secondary | ICD-10-CM | POA: Diagnosis not present

## 2018-03-14 DIAGNOSIS — R0600 Dyspnea, unspecified: Secondary | ICD-10-CM

## 2018-03-14 NOTE — Patient Instructions (Signed)
Medication Instructions: Your physician recommends that you continue on your current medications as directed. Please refer to the Current Medication list given to you today.   Labwork: None Ordred  Procedures/Testing: Your physician has requested that you have an echocardiogram. Echocardiography is a painless test that uses sound waves to create images of your heart. It provides your doctor with information about the size and shape of your heart and how well your heart's chambers and valves are working. This procedure takes approximately one hour. There are no restrictions for this procedure.    Your physician has requested that you have a lexiscan myoview. For further information please visit [email or web address]. Please [email or web address] instruction sheet, as given.    Follow-Up: Your physician recommends that you schedule a follow-up appointment in: 6 months with Dr. Katrinka BlazingSmith    Any Additional Special Instructions Will Be Listed Below (If Applicable). Echocardiogram An echocardiogram, or echocardiography, uses sound waves (ultrasound) to produce an image of your heart. The echocardiogram is simple, painless, obtained within a short period of time, and offers valuable information to your health care provider. The images from an echocardiogram can provide information such as:  Evidence of coronary artery disease (CAD).  Heart size.  Heart muscle function.  Heart valve function.  Aneurysm detection.  Evidence of a past heart attack.  Fluid buildup around the heart.  Heart muscle thickening.  Assess heart valve function.  Tell a health care provider about:  Any allergies you have.  All medicines you are taking, including vitamins, herbs, eye drops, creams, and over-the-counter medicines.  Any problems you or family members have had with anesthetic medicines.  Any blood disorders you have.  Any surgeries you have had.  Any medical conditions you have.  Whether you are pregnant  or may be pregnant. What happens before the procedure? No special preparation is needed. Eat and drink normally. What happens during the procedure?  In order to produce an image of your heart, gel will be applied to your chest and a wand-like tool (transducer) will be moved over your chest. The gel will help transmit the sound waves from the transducer. The sound waves will harmlessly bounce off your heart to allow the heart images to be captured in real-time motion. These images will then be recorded.  You may need an IV to receive a medicine that improves the quality of the pictures. What happens after the procedure? You may return to your normal schedule including diet, activities, and medicines, unless your health care provider tells you otherwise. This information is not intended to replace advice given to you by your health care provider. Make sure you discuss any questions you have with your health care provider. Document Released: 11/12/2000 Document Revised: 07/03/2016 Document Reviewed: 07/23/2013 Elsevier Interactive Patient Education  2017 [employer]      If you need a refill on your cardiac medications before your next appointment, please call your pharmacy.

## 2018-03-21 ENCOUNTER — Telehealth (HOSPITAL_COMMUNITY): Payer: Self-pay | Admitting: *Deleted

## 2018-03-21 NOTE — Telephone Encounter (Signed)
Patient given detailed instructions per Myocardial Perfusion Study Information Sheet for the test on 03/24/18 at 0915. Patient notified to arrive 15 minutes early and that it is imperative to arrive on time for appointment to keep from having the test rescheduled.  If you need to cancel or reschedule your appointment, please call the office within 24 hours of your appointment. . Patient verbalized understanding.Sean Kelley, Adelene IdlerCynthia W

## 2018-03-24 ENCOUNTER — Ambulatory Visit (HOSPITAL_BASED_OUTPATIENT_CLINIC_OR_DEPARTMENT_OTHER): Payer: Medicare Other

## 2018-03-24 ENCOUNTER — Ambulatory Visit (HOSPITAL_COMMUNITY): Payer: Medicare Other | Attending: Cardiovascular Disease

## 2018-03-24 ENCOUNTER — Other Ambulatory Visit: Payer: Self-pay

## 2018-03-24 DIAGNOSIS — R0602 Shortness of breath: Secondary | ICD-10-CM | POA: Diagnosis not present

## 2018-03-24 DIAGNOSIS — R011 Cardiac murmur, unspecified: Secondary | ICD-10-CM

## 2018-03-24 DIAGNOSIS — I77810 Thoracic aortic ectasia: Secondary | ICD-10-CM | POA: Insufficient documentation

## 2018-03-24 DIAGNOSIS — I2510 Atherosclerotic heart disease of native coronary artery without angina pectoris: Secondary | ICD-10-CM | POA: Insufficient documentation

## 2018-03-24 DIAGNOSIS — I25810 Atherosclerosis of coronary artery bypass graft(s) without angina pectoris: Secondary | ICD-10-CM

## 2018-03-24 DIAGNOSIS — I10 Essential (primary) hypertension: Secondary | ICD-10-CM | POA: Diagnosis not present

## 2018-03-24 DIAGNOSIS — R0600 Dyspnea, unspecified: Secondary | ICD-10-CM

## 2018-03-24 DIAGNOSIS — Z951 Presence of aortocoronary bypass graft: Secondary | ICD-10-CM | POA: Insufficient documentation

## 2018-03-24 DIAGNOSIS — R0609 Other forms of dyspnea: Secondary | ICD-10-CM | POA: Diagnosis not present

## 2018-03-24 DIAGNOSIS — I082 Rheumatic disorders of both aortic and tricuspid valves: Secondary | ICD-10-CM | POA: Diagnosis not present

## 2018-03-24 DIAGNOSIS — E785 Hyperlipidemia, unspecified: Secondary | ICD-10-CM | POA: Diagnosis not present

## 2018-03-24 LAB — MYOCARDIAL PERFUSION IMAGING
CHL CUP NUCLEAR SSS: 1
CHL CUP RESTING HR STRESS: 56 {beats}/min
LV dias vol: 83 mL (ref 62–150)
LV sys vol: 33 mL
NUC STRESS TID: 0.85
Peak HR: 76 {beats}/min
RATE: 0.33
SDS: 0
SRS: 1

## 2018-03-24 MED ORDER — TECHNETIUM TC 99M TETROFOSMIN IV KIT
32.9000 | PACK | Freq: Once | INTRAVENOUS | Status: AC | PRN
  Administered 2018-03-24: 32.9 via INTRAVENOUS
  Filled 2018-03-24: qty 33

## 2018-03-24 MED ORDER — TECHNETIUM TC 99M TETROFOSMIN IV KIT
10.4000 | PACK | Freq: Once | INTRAVENOUS | Status: AC | PRN
  Administered 2018-03-24: 10.4 via INTRAVENOUS
  Filled 2018-03-24: qty 11

## 2018-03-24 MED ORDER — REGADENOSON 0.4 MG/5ML IV SOLN
0.4000 mg | Freq: Once | INTRAVENOUS | Status: AC
  Administered 2018-03-24: 0.4 mg via INTRAVENOUS

## 2018-04-16 ENCOUNTER — Other Ambulatory Visit: Payer: Self-pay

## 2018-04-16 ENCOUNTER — Observation Stay (HOSPITAL_BASED_OUTPATIENT_CLINIC_OR_DEPARTMENT_OTHER)
Admission: EM | Admit: 2018-04-16 | Discharge: 2018-04-18 | Disposition: A | Discharge status: Home / Self Care | Payer: Medicare Other | Attending: Internal Medicine | Admitting: Internal Medicine

## 2018-04-16 ENCOUNTER — Emergency Department (HOSPITAL_BASED_OUTPATIENT_CLINIC_OR_DEPARTMENT_OTHER): Payer: Medicare Other

## 2018-04-16 ENCOUNTER — Encounter (HOSPITAL_BASED_OUTPATIENT_CLINIC_OR_DEPARTMENT_OTHER): Payer: Self-pay | Admitting: Emergency Medicine

## 2018-04-16 DIAGNOSIS — R55 Syncope and collapse: Principal | ICD-10-CM | POA: Diagnosis present

## 2018-04-16 DIAGNOSIS — I159 Secondary hypertension, unspecified: Secondary | ICD-10-CM

## 2018-04-16 DIAGNOSIS — Z7982 Long term (current) use of aspirin: Secondary | ICD-10-CM | POA: Insufficient documentation

## 2018-04-16 DIAGNOSIS — I700 Atherosclerosis of aorta: Secondary | ICD-10-CM | POA: Diagnosis not present

## 2018-04-16 DIAGNOSIS — N3289 Other specified disorders of bladder: Secondary | ICD-10-CM

## 2018-04-16 DIAGNOSIS — E7800 Pure hypercholesterolemia, unspecified: Secondary | ICD-10-CM | POA: Diagnosis not present

## 2018-04-16 DIAGNOSIS — Z951 Presence of aortocoronary bypass graft: Secondary | ICD-10-CM | POA: Insufficient documentation

## 2018-04-16 DIAGNOSIS — R001 Bradycardia, unspecified: Secondary | ICD-10-CM | POA: Diagnosis not present

## 2018-04-16 DIAGNOSIS — I25810 Atherosclerosis of coronary artery bypass graft(s) without angina pectoris: Secondary | ICD-10-CM | POA: Diagnosis present

## 2018-04-16 DIAGNOSIS — N281 Cyst of kidney, acquired: Secondary | ICD-10-CM | POA: Insufficient documentation

## 2018-04-16 DIAGNOSIS — I10 Essential (primary) hypertension: Secondary | ICD-10-CM | POA: Diagnosis present

## 2018-04-16 DIAGNOSIS — R2681 Unsteadiness on feet: Secondary | ICD-10-CM | POA: Insufficient documentation

## 2018-04-16 DIAGNOSIS — Z8719 Personal history of other diseases of the digestive system: Secondary | ICD-10-CM | POA: Insufficient documentation

## 2018-04-16 DIAGNOSIS — R32 Unspecified urinary incontinence: Secondary | ICD-10-CM | POA: Insufficient documentation

## 2018-04-16 DIAGNOSIS — R42 Dizziness and giddiness: Secondary | ICD-10-CM | POA: Diagnosis not present

## 2018-04-16 DIAGNOSIS — K219 Gastro-esophageal reflux disease without esophagitis: Secondary | ICD-10-CM | POA: Diagnosis not present

## 2018-04-16 DIAGNOSIS — I6523 Occlusion and stenosis of bilateral carotid arteries: Secondary | ICD-10-CM | POA: Diagnosis not present

## 2018-04-16 DIAGNOSIS — Z79899 Other long term (current) drug therapy: Secondary | ICD-10-CM | POA: Diagnosis not present

## 2018-04-16 DIAGNOSIS — R10A2 Flank pain, left side: Secondary | ICD-10-CM | POA: Diagnosis present

## 2018-04-16 DIAGNOSIS — R2689 Other abnormalities of gait and mobility: Secondary | ICD-10-CM | POA: Insufficient documentation

## 2018-04-16 DIAGNOSIS — R109 Unspecified abdominal pain: Secondary | ICD-10-CM | POA: Diagnosis not present

## 2018-04-16 DIAGNOSIS — M6281 Muscle weakness (generalized): Secondary | ICD-10-CM | POA: Insufficient documentation

## 2018-04-16 DIAGNOSIS — E7849 Other hyperlipidemia: Secondary | ICD-10-CM

## 2018-04-16 DIAGNOSIS — Z9889 Other specified postprocedural states: Secondary | ICD-10-CM | POA: Insufficient documentation

## 2018-04-16 DIAGNOSIS — Z880 Allergy status to penicillin: Secondary | ICD-10-CM | POA: Insufficient documentation

## 2018-04-16 DIAGNOSIS — Z8249 Family history of ischemic heart disease and other diseases of the circulatory system: Secondary | ICD-10-CM | POA: Diagnosis not present

## 2018-04-16 DIAGNOSIS — K598 Other specified functional intestinal disorders: Secondary | ICD-10-CM | POA: Diagnosis not present

## 2018-04-16 HISTORY — DX: Unspecified abdominal pain: R10.9

## 2018-04-16 HISTORY — DX: Other specified disorders of bladder: N32.89

## 2018-04-16 HISTORY — DX: Syncope and collapse: R55

## 2018-04-16 LAB — COMPREHENSIVE METABOLIC PANEL
ALT: 16 U/L — ABNORMAL LOW (ref 17–63)
ANION GAP: 6 (ref 5–15)
AST: 22 U/L (ref 15–41)
Albumin: 3.5 g/dL (ref 3.5–5.0)
Alkaline Phosphatase: 61 U/L (ref 38–126)
BUN: 24 mg/dL — ABNORMAL HIGH (ref 6–20)
CO2: 24 mmol/L (ref 22–32)
Calcium: 7.9 mg/dL — ABNORMAL LOW (ref 8.9–10.3)
Chloride: 108 mmol/L (ref 101–111)
Creatinine, Ser: 1.07 mg/dL (ref 0.61–1.24)
GFR calc non Af Amer: >60 mL/min (ref >60)
Glucose, Bld: 117 mg/dL — ABNORMAL HIGH (ref 65–99)
POTASSIUM: 4 mmol/L (ref 3.5–5.1)
Sodium: 138 mmol/L (ref 135–145)
Total Bilirubin: 0.2 mg/dL — ABNORMAL LOW (ref 0.3–1.2)
Total Protein: 6.1 g/dL — ABNORMAL LOW (ref 6.5–8.1)

## 2018-04-16 LAB — URINALYSIS, ROUTINE W REFLEX MICROSCOPIC
Bilirubin Urine: NEGATIVE
Glucose, UA: NEGATIVE mg/dL
Hgb urine dipstick: NEGATIVE
Ketones, ur: NEGATIVE mg/dL
Leukocytes, UA: NEGATIVE
NITRITE: NEGATIVE
Protein, ur: NEGATIVE mg/dL
SPECIFIC GRAVITY, URINE: 1.025 (ref 1.005–1.030)
pH: 5.5 (ref 5.0–8.0)

## 2018-04-16 LAB — CBC WITH DIFFERENTIAL/PLATELET
Basophils Absolute: 0 10*3/uL (ref 0.0–0.1)
Basophils Relative: 0 %
Eosinophils Absolute: 0.2 10*3/uL (ref 0.0–0.7)
Eosinophils Relative: 3 %
HCT: 38.6 % — ABNORMAL LOW (ref 39.0–52.0)
HEMOGLOBIN: 13.2 g/dL (ref 13.0–17.0)
Lymphocytes Relative: 29 %
Lymphs Abs: 1.9 10*3/uL (ref 0.7–4.0)
MCH: 33.6 pg (ref 26.0–34.0)
MCHC: 34.2 g/dL (ref 30.0–36.0)
MCV: 98.2 fL (ref 78.0–100.0)
Monocytes Absolute: 0.9 10*3/uL (ref 0.1–1.0)
Monocytes Relative: 13 %
NEUTROS PCT: 55 %
Neutro Abs: 3.5 10*3/uL (ref 1.7–7.7)
PLATELETS: 191 10*3/uL (ref 150–400)
RBC: 3.93 MIL/uL — AB (ref 4.22–5.81)
RDW: 14.1 % (ref 11.5–15.5)
WBC: 6.5 10*3/uL (ref 4.0–10.5)

## 2018-04-16 LAB — TROPONIN I

## 2018-04-16 LAB — LIPASE, BLOOD: LIPASE: 28 U/L (ref 11–51)

## 2018-04-16 LAB — TSH: TSH: 0.981 u[IU]/mL (ref 0.350–4.500)

## 2018-04-16 MED ORDER — POTASSIUM CHLORIDE CRYS ER 10 MEQ PO TBCR: 10.0000 meq | EXTENDED_RELEASE_TABLET | Freq: Every day | ORAL | Status: DC

## 2018-04-16 MED ORDER — SODIUM CHLORIDE 0.9 % IV SOLN: 250.0000 mL | INTRAVENOUS | Status: DC | PRN

## 2018-04-16 MED ORDER — OXYCODONE-ACETAMINOPHEN 5-325 MG PO TABS
1.0000 | ORAL_TABLET | ORAL | Status: DC | PRN
  Administered 2018-04-17 (×2): 1 via ORAL
  Filled 2018-04-16 (×2): qty 1

## 2018-04-16 MED ORDER — ENOXAPARIN SODIUM 40 MG/0.4ML ~~LOC~~ SOLN
40.0000 mg | SUBCUTANEOUS | Status: DC
  Administered 2018-04-17: 40 mg via SUBCUTANEOUS
  Filled 2018-04-16: qty 0.4

## 2018-04-16 MED ORDER — MORPHINE SULFATE (PF) 4 MG/ML IV SOLN
4.0000 mg | Freq: Once | INTRAVENOUS | Status: AC
  Administered 2018-04-16: 4 mg via INTRAVENOUS
  Filled 2018-04-16: qty 1

## 2018-04-16 MED ORDER — ONDANSETRON HCL 4 MG/2ML IJ SOLN
4.0000 mg | Freq: Once | INTRAMUSCULAR | Status: AC
  Administered 2018-04-16: 4 mg via INTRAVENOUS
  Filled 2018-04-16: qty 2

## 2018-04-16 MED ORDER — ASPIRIN EC 81 MG PO TBEC
81.0000 mg | DELAYED_RELEASE_TABLET | Freq: Every day | ORAL | Status: DC
  Administered 2018-04-16 – 2018-04-18 (×3): 81 mg via ORAL
  Filled 2018-04-16 (×3): qty 1

## 2018-04-16 MED ORDER — ENOXAPARIN SODIUM 40 MG/0.4ML ~~LOC~~ SOLN
40.0000 mg | SUBCUTANEOUS | Status: DC
  Administered 2018-04-16: 40 mg via SUBCUTANEOUS
  Filled 2018-04-16: qty 0.4

## 2018-04-16 MED ORDER — ACETAMINOPHEN 650 MG RE SUPP: 650.0000 mg | Freq: Four times a day (QID) | RECTAL | Status: DC | PRN

## 2018-04-16 MED ORDER — MORPHINE SULFATE (PF) 2 MG/ML IV SOLN: 1.0000 mg | INTRAVENOUS | Status: DC | PRN

## 2018-04-16 MED ORDER — SODIUM CHLORIDE 0.9% FLUSH
3.0000 mL | INTRAVENOUS | Status: DC | PRN
  Filled 2018-04-16: qty 3

## 2018-04-16 MED ORDER — SODIUM CHLORIDE 0.9 % IV SOLN
INTRAVENOUS | Status: DC
  Administered 2018-04-16 – 2018-04-18 (×2): via INTRAVENOUS

## 2018-04-16 MED ORDER — POLYETHYLENE GLYCOL 3350 17 G PO PACK: 17.0000 g | PACK | Freq: Every day | ORAL | Status: DC | PRN

## 2018-04-16 MED ORDER — LISINOPRIL-HYDROCHLOROTHIAZIDE 20-12.5 MG PO TABS: 1.0000 | ORAL_TABLET | Freq: Every day | ORAL | Status: DC

## 2018-04-16 MED ORDER — ONDANSETRON HCL 4 MG/2ML IJ SOLN: 4.0000 mg | Freq: Four times a day (QID) | INTRAMUSCULAR | Status: DC | PRN

## 2018-04-16 MED ORDER — OXYCODONE HCL 5 MG PO TABS: 5.0000 mg | ORAL_TABLET | ORAL | Status: DC | PRN

## 2018-04-16 MED ORDER — IOPAMIDOL (ISOVUE-370) INJECTION 76%
100.0000 mL | Freq: Once | INTRAVENOUS | Status: AC | PRN
  Administered 2018-04-16: 100 mL via INTRAVENOUS

## 2018-04-16 MED ORDER — HYDROCHLOROTHIAZIDE 12.5 MG PO CAPS
12.5000 mg | ORAL_CAPSULE | Freq: Every day | ORAL | Status: DC
  Filled 2018-04-16 (×2): qty 1

## 2018-04-16 MED ORDER — SODIUM CHLORIDE 0.9% FLUSH
3.0000 mL | Freq: Two times a day (BID) | INTRAVENOUS | Status: DC
  Filled 2018-04-16: qty 3

## 2018-04-16 MED ORDER — SODIUM CHLORIDE 0.9% FLUSH
3.0000 mL | Freq: Two times a day (BID) | INTRAVENOUS | Status: DC
  Administered 2018-04-17: 3 mL via INTRAVENOUS

## 2018-04-16 MED ORDER — ONDANSETRON HCL 4 MG PO TABS: 4.0000 mg | ORAL_TABLET | Freq: Four times a day (QID) | ORAL | Status: DC | PRN

## 2018-04-16 MED ORDER — SODIUM CHLORIDE 0.9% FLUSH
3.0000 mL | Freq: Two times a day (BID) | INTRAVENOUS | Status: DC
  Administered 2018-04-16 – 2018-04-17 (×2): 3 mL via INTRAVENOUS
  Filled 2018-04-16: qty 3

## 2018-04-16 MED ORDER — POLYETHYLENE GLYCOL 3350 17 G PO PACK
17.0000 g | PACK | Freq: Every day | ORAL | Status: DC | PRN
  Filled 2018-04-16: qty 1

## 2018-04-16 MED ORDER — SODIUM CHLORIDE 0.9 % IV BOLUS
1000.0000 mL | Freq: Once | INTRAVENOUS | Status: AC
  Administered 2018-04-16: 1000 mL via INTRAVENOUS

## 2018-04-16 MED ORDER — SIMVASTATIN 20 MG PO TABS
20.0000 mg | ORAL_TABLET | Freq: Every day | ORAL | Status: DC
  Administered 2018-04-16 – 2018-04-17 (×2): 20 mg via ORAL
  Filled 2018-04-16 (×3): qty 1

## 2018-04-16 MED ORDER — SENNOSIDES-DOCUSATE SODIUM 8.6-50 MG PO TABS: 1.0000 | ORAL_TABLET | Freq: Every evening | ORAL | Status: DC | PRN

## 2018-04-16 MED ORDER — LISINOPRIL 20 MG PO TABS: 20.0000 mg | ORAL_TABLET | Freq: Every day | ORAL | Status: DC

## 2018-04-16 MED ORDER — METHOCARBAMOL 500 MG PO TABS
500.0000 mg | ORAL_TABLET | Freq: Three times a day (TID) | ORAL | Status: DC | PRN
  Administered 2018-04-16 – 2018-04-17 (×3): 500 mg via ORAL
  Filled 2018-04-16 (×3): qty 1

## 2018-04-16 MED ORDER — ACETAMINOPHEN 325 MG PO TABS: 650.0000 mg | ORAL_TABLET | Freq: Four times a day (QID) | ORAL | Status: DC | PRN

## 2018-04-16 NOTE — Care Management (Signed)
84 year old male past medical history significant for coronary artery disease recent evaluation for chest pain with a negative stress test and echocardiogram 1 month ago which showed some diastolic dysfunction, hyperlipidemia and hypertension presented to the emergency department at [facility] with a complaint of near syncope.  He was sitting at his computer last night he became very sweaty and dizzy and lightheaded.  He had some associated left flank pain that got worse with movement.  He was short of breath and the pain was worse with a deep breath.  Also had an episode of incontinence which concerned him and his wife is a has never been a problem.  In the emergency department he underwent a CT scan of the chest abdomen and pelvis and that was unremarkable.  It showed some dilated jejunal loops but no acute changes and no aneurysmal dilatation.  He had some mild EKG changes he has been followed on telemetry and has had some episodes of bradycardia while in the emergency department.  Patient will be transferred here for observation for vasovagal syncope and to monitor his heart rate.  He might benefit from an outpatient or monitor.

## 2018-04-16 NOTE — ED Notes (Signed)
Unable to urinate at this time.  

## 2018-04-16 NOTE — ED Notes (Signed)
Patient transported to CT 

## 2018-04-16 NOTE — ED Notes (Signed)
ED Provider at bedside. 

## 2018-04-16 NOTE — ED Triage Notes (Addendum)
States was sitting at his computer last night around midnight and broke out in a sweat, then became dizzy and felt like he was about to pass out and had urinated on himself . Left flank pain after this that radiates to side, worse with movement

## 2018-04-16 NOTE — ED Notes (Signed)
[facility] is here to pick up patient.

## 2018-04-16 NOTE — ED Notes (Signed)
Patient transported to X-ray 

## 2018-04-16 NOTE — ED Notes (Signed)
Attempted to call report

## 2018-04-16 NOTE — ED Provider Notes (Signed)
[facility] EMERGENCY DEPARTMENT Provider Note   CSN: [id number] Arrival date & time: 04/16/18  7829     History   Chief Complaint Chief Complaint  Patient presents with  . Flank Pain    HPI Sean Kelley is a 84 y.o. male.  HPI Patient presents to the emergency room for evaluation of flank pain.  Patient states last evening when he was sitting at the computer he had sudden onset of feeling nauseated, diaphoretic and lightheaded.  He felt like he was going to pass out and his vision started to walk out.  Patient symptoms slowly resolved and he did not lose consciousness.  He did have an episode of urinary incontinence during the spell.  Following that episode the patient began having pain in his left flank.  Patient symptoms continued throughout the night.  Pain increases with any movement as well as deep breathing.  He denies any trouble with coughing, fever, chest pain, or shortness of breath.  He denies any abdominal pain or discomfort with urination.  Symptoms persisted this morning so he decided to come into the emergency room for evaluation.  Patient states he had an evaluation of his heart recently because of some symptoms of chest discomfort.  According to the family that work-up was reassuring Past Medical History:  Diagnosis Date  . CAD (coronary artery disease)    Hx CABG  . Gastric ulcer   . GERD (gastroesophageal reflux disease)   . High cholesterol   . Hypertension     Patient Active Problem List   Diagnosis Date Noted  . DOE (dyspnea on exertion) 03/13/2018  . Chest pain 06/11/2014  . HTN (hypertension) 02/19/2012  . Arm pain 02/19/2012  . CAD (coronary artery disease) of bypass graft 02/19/2012  . HLD (hyperlipidemia) 02/19/2012    Past Surgical History:  Procedure Laterality Date  . CORONARY ARTERY BYPASS GRAFT    . gastric ulcer surgery          Home Medications    Prior to Admission medications   Medication Sig Start Date End Date  Taking? Authorizing Provider  aspirin EC 81 MG tablet Take 81 mg by mouth daily.    [provider]  lisinopril-hydrochlorothiazide (PRINZIDE,ZESTORETIC) 20-12.5 MG per tablet Take 1 tablet by mouth daily.      [provider]  potassium chloride (K-DUR,KLOR-CON) 10 MEQ tablet Take 10 mEq by mouth daily.      [provider]  simvastatin (ZOCOR) 20 MG tablet Take 20 mg by mouth at bedtime.      [provider]  tamsulosin (FLOMAX) 0.4 MG CAPS capsule Take 0.4 mg by mouth daily.  03/21/14   [provider]    Family History Family History  Problem Relation Age of Onset  . Heart failure Father     Social History Social History   Tobacco Use  . Smoking status: Never Smoker  . Smokeless tobacco: Never Used  Substance Use Topics  . Alcohol use: No    Alcohol/week: 0.0 oz  . Drug use: No     Allergies   Penicillins   Review of Systems Review of Systems  All other systems reviewed and are negative.    Physical Exam Updated Vital Signs BP (!) 129/55   Pulse 61   Temp 98.6 F (37 C) (Oral)   Resp 12   Ht 1.727 m ( )   Wt 80.7 kg (178 lb)   SpO2 96%   BMI 27.06 kg/m  Physical Exam  Constitutional: He appears well-developed and well-nourished. No distress.  HENT:  Head: Normocephalic and atraumatic.  Right Ear: External ear normal.  Left Ear: External ear normal.  Eyes: Conjunctivae are normal. Right eye exhibits no discharge. Left eye exhibits no discharge. No scleral icterus.  Neck: Neck supple. No tracheal deviation present.  Cardiovascular: Normal rate, regular rhythm and intact distal pulses.  Pulmonary/Chest: Effort normal and breath sounds normal. No stridor. No respiratory distress. He has no wheezes. He has no rales.  Abdominal: Soft. Bowel sounds are normal. He exhibits no distension. There is no tenderness. There is CVA tenderness ( left). There is no rebound and no guarding.  Musculoskeletal: He exhibits no  edema or tenderness.  Neurological: He is alert. He has normal strength. No cranial nerve deficit (no facial droop, extraocular movements intact, no slurred speech) or sensory deficit. He exhibits normal muscle tone. He displays no seizure activity. Coordination normal.  Skin: Skin is warm and dry. No rash noted.  Psychiatric: He has a normal mood and affect.  Nursing note and vitals reviewed.    ED Treatments / Results  Labs (all labs ordered are listed, but only abnormal results are displayed) Labs Reviewed  CBC WITH DIFFERENTIAL/PLATELET - Abnormal; Notable for the following components:      Result Value   RBC 3.93 (*)    HCT 38.6 (*)    All other components within normal limits  COMPREHENSIVE METABOLIC PANEL - Abnormal; Notable for the following components:   Glucose, Bld 117 (*)    BUN 24 (*)    Calcium 7.9 (*)    Total Protein 6.1 (*)    ALT 16 (*)    Total Bilirubin 0.2 (*)    All other components within normal limits  URINALYSIS, ROUTINE W REFLEX MICROSCOPIC  TROPONIN I  LIPASE, BLOOD  TROPONIN I    EKG EKG Interpretation  Date/Time:  Sunday Apr 16 2018 08:09:05 EDT Ventricular Rate:  61 PR Interval:    QRS Duration: 91 QT Interval:  430 QTC Calculation: 434 R Axis:   145 Text Interpretation:  Sinus rhythm Anterior infarct, old Abnrm T, consider ischemia, anterolateral lds Baseline wander in lead(s) V2 V4 V5 t wave changes more pronounced  since last tracing Confirmed by Linwood Dibbles [phone]) on 04/16/2018 8:19:47 AM   Radiology Dg Chest 2 View  Result Date: 04/16/2018 CLINICAL DATA:  Dizziness with diaphoresis beginning last night with urinary incontinence. Left flank pain. EXAM: CHEST - 2 VIEW COMPARISON:  06/04/2014 FINDINGS: Lungs are adequately inflated with moderate elevation the right hemidiaphragm unchanged. No focal airspace consolidation or effusion. Cardiomediastinal silhouette and remainder of the exam is unchanged. IMPRESSION: No active cardiopulmonary  disease. Electronically Signed   By: Elberta Fortis M.D.   On: 04/16/2018 08:50   Ct Angio Chest/abd/pel For Dissection W And/or Wo Contrast  Result Date: 04/16/2018 CLINICAL DATA:  Left flank pain 1 day. Pain began after syncopal episode last night. EXAM: CT ANGIOGRAPHY CHEST, ABDOMEN AND PELVIS TECHNIQUE: Multidetector CT imaging through the chest, abdomen and pelvis was performed using the standard protocol during bolus administration of intravenous contrast. Multiplanar reconstructed images and MIPs were obtained and reviewed to evaluate the vascular anatomy. CONTRAST:  ISOVUE-370 IOPAMIDOL (ISOVUE-370) INJECTION 76% COMPARISON:  Chest CT 02/19/2012 FINDINGS: CTA CHEST FINDINGS Cardiovascular: Heart is normal size. Thoracic aorta is normal in caliber without evidence of aneurysm or dissection. Minimal calcified plaque over the thoracic aorta. Normal takeoff of the great vessels. Mild noncalcified  plaque over the anterior aspect of the right brachiocephalic artery. Pulmonary arterial system is well opacified without emboli. Mediastinum/Nodes: No evidence of mediastinal or hilar adenopathy. Remaining mediastinal structures are normal. Lungs/Pleura: Lungs are adequately inflated without focal consolidation or effusion. There is elevation of the right hemidiaphragm. Airways are normal. Musculoskeletal: Mediastinum wires are present. Minimal degenerate change of the spine. Review of the MIP images confirms the above findings. CTA ABDOMEN AND PELVIS FINDINGS VASCULAR Aorta: Abdominal aorta is normal in caliber without aneurysm or dissection. There is mild calcified plaque throughout the abdominal aorta. Celiac: There is a diminutive celiac axis with moderate focal stenosis at the origin. SMA: Patent. Renals: Single bilateral renal arteries are patent with minimal calcified plaque at the origin of the left renal artery. IMA: Patent. Inflow: Calcified plaque throughout the iliac arteries without evidence of  focal stenosis or occlusion. Veins: Unremarkable. Review of the MIP images confirms the above findings. NON-VASCULAR Hepatobiliary: Within normal. Pancreas: Normal. Spleen: Absent. Adrenals/Urinary Tract: Adrenal glands are within normal. Kidneys normal size without hydronephrosis or nephrolithiasis. 1.7 cm cyst over the mid to lower pole left kidney. Ureters are within normal. Rounded 2.3 cm density projecting over the posterior bladder wall which may represent an intrinsic bladder mass versus impression from nodular adjacent prostate gland. Stomach/Bowel: Stomach is within normal. There are a couple mildly dilated fluid-filled proximal jejunal loops with gradual transition to normal caliber small bowel. Appendix is normal. Colon is normal. Lymphatic: No adenopathy. Reproductive: Possible nodular prostatic projection towards the bladder base versus intrinsic bladder mass as described above. Other: No free fluid or focal inflammatory change. Musculoskeletal: Mild degenerate change of the spine. Review of the MIP images confirms the above findings. IMPRESSION: No evidence of aneurysm or dissection involving the thoracoabdominal aorta. Couple mildly dilated proximal jejunal loops with gradual transition to normal caliber small bowel likely within normal. Otherwise, no acute findings in the chest, abdomen or pelvis. 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 Mild atherosclerotic disease of the thoracoabdominal aorta and iliac arteries. 2.3 cm nodular focus projecting over the posterior bladder base which may represent intrinsic bladder mass versus nodular projection from the adjacent prostate gland. Urology protocol CT may be helpful for further evaluation. 1.7 cm left renal cyst. Electronically Signed   By: Elberta Fortis M.D.   On: 04/16/2018 13:07    Procedures Procedures (including critical care time)  Medications Ordered in ED Medications  sodium chloride 0.9 % bolus 1,000 mL (0 mLs  Intravenous Stopped 04/16/18 1023)    And  0.9 %  sodium chloride infusion ( Intravenous New Bag/Given 04/16/18 1025)  ondansetron (ZOFRAN) injection 4 mg (4 mg Intravenous Given 04/16/18 0826)  morphine 4 MG/ML injection 4 mg (4 mg Intravenous Given 04/16/18 0833)  morphine 4 MG/ML injection 4 mg (4 mg Intravenous Given 04/16/18 1035)  iopamidol (ISOVUE-370) 76 % injection 100 mL (100 mLs Intravenous Contrast Given 04/16/18 1202)     Initial Impression / Assessment and Plan / ED Course  I have reviewed the triage vital signs and the nursing notes.  Pertinent labs & imaging results that were available during my care of the patient were reviewed by me and considered in my medical decision making (see chart for details).  Clinical Course as of Apr 16 1338  Wynelle Link Apr 16, 2018  1027 Previous records reviewed.  Normal stress test in April of this year   [JK]  1023 Labs reviewed.  No significant abnormalities.    [JK]  1336 CT scan without any concerning abnormalities.  Incidental possible nodule in the bladder noted.   [JK]  1336 Discussed findings and results with the patient.  He is concerned about why he passed out considering he did not have any pain or discomfort initially.   [JK]    Clinical Course User Index [JK] Linwood Dibbles, MD    Patient presented to the emergency room for evaluation of a near syncopal episode associated with urinary incontinence followed by flank pain.  Patient's initial ED work-up is reassuring without any evidence of acute cardiac ischemia.  No evidence of any thoracic or abdominal aortic dissection or aneurysm.    It is unclear what specifically triggered his near syncopal episode.  Sounds somewhat vasovagal in nature however the patient was not have any pain initially.  Patient is understandably concerned about the degree of his symptoms and his urinary incontinence.  Patient has been noted to have some episodes of bradycardia while in the ED. it is possible he had some  type of dysrhythmia.  Considering his age and comorbidities I do think it is reasonable to bring him in for monitoring, serial cardiac enzymes.  I will consult with medical service at [facility] to arrange for possible admission for observation.  Final Clinical Impressions(s) / ED Diagnoses   Final diagnoses:  Syncope, unspecified syncope type  Flank pain      Linwood Dibbles, MD 04/16/18 1339

## 2018-04-16 NOTE — H&P (Signed)
History and Physical    Sean Kelley:[id number] DOB: [birth year] DOA: 04/16/2018  Referring MD/NP/PA:   PCP: Renford Dills, MD   Patient coming from:  The patient is coming from home.  At baseline, pt is independent for most of ADL.  Chief Complaint: Syncope and left flank pain  HPI: Sean Kelley is a 84 y.o. male with medical history significant of hypertension, hyperlipidemia, CAD, CABG, gastric ulcer, who presents with syncope, left flank pain.  Per family, patient passed out at about 12 last night when he was sitting in front of computer.  Episode lasted for about 3 to 4 minutes.  Patient states that before he passed out, he felt hot and sweaty.  Denies any unilateral weakness, numbness or tingling his extremities.  No facial droop, slurred speech.  Denies any chest pain, shortness of breath, cough.  No fever or chills.  Patient does not have nausea, vomiting, diarrhea, abdominal pain, symptoms of UTI.  Patient reports left flank pain, which is constant, sharp, reached up to 10 out of 10 severity, nonradiating.  Denies any injury recently.  No hematuria.  Per report, patient had bradycardia.  Patient had oxygen desaturation to 81% on room air per family, but his oxygen saturation 98% on room air now.  ED Course: pt was found to have WBC 6.5, troponin negative x2, negative urinalysis for UTI and negative hemoglobin, electrolytes renal function okay, heart rate 50s, no tachypnea.  Chest x-ray negative.  Patient is placed on telemetry bed of observation.  # Chest/CTA of chest/abdomen/pelvis: 1. No evidence of aneurysm or dissection involving the thoracoabdominal aorta. 2. Couple mildly dilated proximal jejunal loops with gradual transition to normal caliber small bowel likely within normal. Otherwise, no acute findings in the chest, abdomen or pelvis. 3. 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 4. Mild atherosclerotic disease of the thoracoabdominal aorta and iliac  arteries. 5. 2.3 cm nodular focus projecting over the posterior bladder base which may represent intrinsic bladder mass versus nodular projection from the adjacent prostate gland.  6. 1.7 cm left renal cyst.  Review of Systems:   General: no fevers, chills, no body weight gain, has fatigue HEENT: no blurry vision, hearing changes or sore throat Respiratory: no dyspnea, coughing, wheezing CV: no chest pain, no palpitations GI: no nausea, vomiting, abdominal pain, diarrhea, constipation GU: no dysuria, burning on urination, increased urinary frequency, hematuria  Ext: no leg edema Neuro: no unilateral weakness, numbness, or tingling, no vision change or hearing loss. Had syncope Skin: no rash, no skin tear. MSK: No muscle spasm, no deformity, no limitation of range of movement in spin. Has left flank pain. Heme: No easy bruising.  Travel history: No recent long distant travel.  Allergy:  Allergies  Allergen Reactions  . Penicillins Rash    Past Medical History:  Diagnosis Date  . CAD (coronary artery disease)    Hx CABG  . Gastric ulcer   . GERD (gastroesophageal reflux disease)   . High cholesterol   . Hypertension     Past Surgical History:  Procedure Laterality Date  . CORONARY ARTERY BYPASS GRAFT    . gastric ulcer surgery      Social History:  reports that he has never smoked. He has never used smokeless tobacco. He reports that he does not drink alcohol or use drugs.  Family History:  Family History  Problem Relation Age of Onset  . Heart failure Father      Prior to  Admission medications   Medication Sig Start Date End Date Taking? Authorizing Provider  aspirin EC 81 MG tablet Take 81 mg by mouth daily.    [provider]  lisinopril-hydrochlorothiazide (PRINZIDE,ZESTORETIC) 20-12.5 MG per tablet Take 1 tablet by mouth daily.      [provider]  potassium chloride (K-DUR,KLOR-CON) 10 MEQ tablet Take 10 mEq by mouth daily.      [provider]  simvastatin (ZOCOR) 20 MG tablet Take 20 mg by mouth at bedtime.      [provider]  tamsulosin (FLOMAX) 0.4 MG CAPS capsule Take 0.4 mg by mouth daily.  03/21/14   [provider]    Physical Exam: Vitals:   04/16/18 1330 04/16/18 1400 04/16/18 1814 04/16/18 2053  BP: 134/61 (!) 110/53 (!) 132/55 (!) 100/43  Pulse: (!) 58 (!) 55 (!) 59 (!) 57  Resp: Temp:   98.6 F (37 C) 98.4 F (36.9 C)  TempSrc:   Oral Oral  SpO2: 96% 94% 98% 90%  Weight:   80.6 kg (177 lb 12.8 oz)   Height:    (1.727 m)    General: Not in acute distress HEENT:       Eyes: PERRL, EOMI, no scleral icterus.       ENT: No discharge from the ears and nose, no pharynx injection, no tonsillar enlargement.        Neck: No JVD, no bruit, no mass felt. Heme: No neck lymph node enlargement. Cardiac: S1/S2, RRR, No murmurs, No gallops or rubs. Respiratory: No rales, wheezing, rhonchi or rubs. GI: Soft, nondistended, nontender, no rebound pain, no organomegaly, BS present. GU: No hematuria Ext: No pitting leg edema bilaterally. 2+DP/PT pulse bilaterally. Musculoskeletal: has tenderness in left flank area without local erythema, ecchymosis Skin: No rashes.  Neuro: Alert, oriented X3, cranial nerves II-XII grossly intact, moves all extremities normally. Muscle strength 5/5 in all extremities, sensation to light touch intact. Brachial reflex 2+ bilaterally. Negative Babinski's sign. Normal finger to nose test. Psych: Patient is not psychotic, no suicidal or hemocidal ideation.  Labs on Admission: I have personally reviewed following labs and imaging studies  CBC: Recent Labs  Lab 04/16/18 0818  WBC 6.5  NEUTROABS 3.5  HGB 13.2  HCT 38.6*  MCV 98.2  PLT 191   Basic Metabolic Panel: Recent Labs  Lab 04/16/18 0909  NA 138  K 4.0  CL 108  CO2 24  GLUCOSE 117*  BUN 24*  CREATININE 1.07  CALCIUM 7.9*   GFR: Estimated Creatinine Clearance: 47.9 mL/min  (by C-G formula based on SCr of 1.07 mg/dL). Liver Function Tests: Recent Labs  Lab 04/16/18 0909  AST 22  ALT 16*  ALKPHOS 61  BILITOT 0.2*  PROT 6.1*  ALBUMIN 3.5   Recent Labs  Lab 04/16/18 0909  LIPASE 28   No results for input(s): AMMONIA in the last 168 hours. Coagulation Profile: No results for input(s): INR, PROTIME in the last 168 hours. Cardiac Enzymes: Recent Labs  Lab 04/16/18 0818 04/16/18 1230  TROPONINI <0.03 <0.03   BNP (last 3 results) No results for input(s): PROBNP in the last 8760 hours. HbA1C: No results for input(s): HGBA1C in the last 72 hours. CBG: No results for input(s): GLUCAP in the last 168 hours. Lipid Profile: No results for input(s): CHOL, HDL, LDLCALC, TRIG, CHOLHDL, LDLDIRECT in the last 72 hours. Thyroid Function Tests: No results for input(s): TSH, T4TOTAL, FREET4, T3FREE, THYROIDAB in the last  72 hours. Anemia Panel: No results for input(s): VITAMINB12, FOLATE, FERRITIN, TIBC, IRON, RETICCTPCT in the last 72 hours. Urine analysis:    Component Value Date/Time   COLORURINE YELLOW 04/16/2018 1110   APPEARANCEUR CLEAR 04/16/2018 1110   LABSPEC 1.025 04/16/2018 1110   PHURINE 5.5 04/16/2018 1110   GLUCOSEU NEGATIVE 04/16/2018 1110   HGBUR NEGATIVE 04/16/2018 1110   BILIRUBINUR NEGATIVE 04/16/2018 1110   KETONESUR NEGATIVE 04/16/2018 1110   PROTEINUR NEGATIVE 04/16/2018 1110   UROBILINOGEN 0.2 06/15/2008 0805   NITRITE NEGATIVE 04/16/2018 1110   LEUKOCYTESUR NEGATIVE 04/16/2018 1110   Sepsis Labs: (procalcitonin:4,lacticidven:4) )No results found for this or any previous visit (from the past 240 hour(s)).   Radiological Exams on Admission: Dg Chest 2 View  Result Date: 04/16/2018 CLINICAL DATA:  Dizziness with diaphoresis beginning last night with urinary incontinence. Left flank pain. EXAM: CHEST - 2 VIEW COMPARISON:  06/04/2014 FINDINGS: Lungs are adequately inflated with moderate elevation the right  hemidiaphragm unchanged. No focal airspace consolidation or effusion. Cardiomediastinal silhouette and remainder of the exam is unchanged. IMPRESSION: No active cardiopulmonary disease. Electronically Signed   By: Elberta Fortis M.D.   On: 04/16/2018 08:50   Ct Angio Chest/abd/pel For Dissection W And/or Wo Contrast  Result Date: 04/16/2018 CLINICAL DATA:  Left flank pain 1 day. Pain began after syncopal episode last night. EXAM: CT ANGIOGRAPHY CHEST, ABDOMEN AND PELVIS TECHNIQUE: Multidetector CT imaging through the chest, abdomen and pelvis was performed using the standard protocol during bolus administration of intravenous contrast. Multiplanar reconstructed images and MIPs were obtained and reviewed to evaluate the vascular anatomy. CONTRAST:  ISOVUE-370 IOPAMIDOL (ISOVUE-370) INJECTION 76% COMPARISON:  Chest CT 02/19/2012 FINDINGS: CTA CHEST FINDINGS Cardiovascular: Heart is normal size. Thoracic aorta is normal in caliber without evidence of aneurysm or dissection. Minimal calcified plaque over the thoracic aorta. Normal takeoff of the great vessels. Mild noncalcified plaque over the anterior aspect of the right brachiocephalic artery. Pulmonary arterial system is well opacified without emboli. Mediastinum/Nodes: No evidence of mediastinal or hilar adenopathy. Remaining mediastinal structures are normal. Lungs/Pleura: Lungs are adequately inflated without focal consolidation or effusion. There is elevation of the right hemidiaphragm. Airways are normal. Musculoskeletal: Mediastinum wires are present. Minimal degenerate change of the spine. Review of the MIP images confirms the above findings. CTA ABDOMEN AND PELVIS FINDINGS VASCULAR Aorta: Abdominal aorta is normal in caliber without aneurysm or dissection. There is mild calcified plaque throughout the abdominal aorta. Celiac: There is a diminutive celiac axis with moderate focal stenosis at the origin. SMA: Patent. Renals: Single bilateral renal  arteries are patent with minimal calcified plaque at the origin of the left renal artery. IMA: Patent. Inflow: Calcified plaque throughout the iliac arteries without evidence of focal stenosis or occlusion. Veins: Unremarkable. Review of the MIP images confirms the above findings. NON-VASCULAR Hepatobiliary: Within normal. Pancreas: Normal. Spleen: Absent. Adrenals/Urinary Tract: Adrenal glands are within normal. Kidneys normal size without hydronephrosis or nephrolithiasis. 1.7 cm cyst over the mid to lower pole left kidney. Ureters are within normal. Rounded 2.3 cm density projecting over the posterior bladder wall which may represent an intrinsic bladder mass versus impression from nodular adjacent prostate gland. Stomach/Bowel: Stomach is within normal. There are a couple mildly dilated fluid-filled proximal jejunal loops with gradual transition to normal caliber small bowel. Appendix is normal. Colon is normal. Lymphatic: No adenopathy. Reproductive: Possible nodular prostatic projection towards the bladder base versus intrinsic bladder mass as described above. Other: No free fluid or focal inflammatory  change. Musculoskeletal: Mild degenerate change of the spine. Review of the MIP images confirms the above findings. IMPRESSION: No evidence of aneurysm or dissection involving the thoracoabdominal aorta. Couple mildly dilated proximal jejunal loops with gradual transition to normal caliber small bowel likely within normal. Otherwise, no acute findings in the chest, abdomen or pelvis. 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 Mild atherosclerotic disease of the thoracoabdominal aorta and iliac arteries. 2.3 cm nodular focus projecting over the posterior bladder base which may represent intrinsic bladder mass versus nodular projection from the adjacent prostate gland. Urology protocol CT may be helpful for further evaluation. 1.7 cm left renal cyst. Electronically Signed   By: Elberta Fortis M.D.    On: 04/16/2018 13:07     EKG: Independently reviewed.  Sinus rhythm, QTC 434, Q waves in inferior leads, T wave inversion in lead I/aVL, V1-V2, poor R wave progression  Assessment/Plan Principal Problem:   Syncope Active Problems:   HTN (hypertension)   CAD (coronary artery disease) of bypass graft   HLD (hyperlipidemia)   Left flank pain   Bladder mass   Syncope: Etiology is not clear. The differential diagnosis is broad, including vasovagal syncope, TIA, bradycardia arrhythmia, drug abuse, orthostatic status, carotid artery stenosis. No seizure activity.  Patient just had 2D echo on 03/24/2018 which showed EF seated at 65% with diastolic dysfunction.  Patient does not have leg edema or JVD, no CHF exacerbation today.  - Place on tele bed for obs - Orthostatic vital signs  - Carotid doppler, MRI-brain - Neuro checks  - IVF:1L NS, then 125 cc/h - PT/OT eval and treat  Essential hypertension: -IV Hydralazine prn -Continue home medications: Prinzide  CAD (coronary artery disease) of bypass graft: No troponin negative x2.  Had a negative stress test in April 2019. -Continue aspirin, Zocor  HLD:  -zocor  Left flank pain: Etiology is not clear.  CT angiogram did not show stone.  Patient does not have hematuria.  Urinalysis did not show hemoglobin or red blood cell, less likely to have stone.  Possibly due to muscle strain. -Try as needed Robaxin, Percocet, morphine  Bladder mass: Etiology is not clear.  Incidental finding -May need to will give referral to urology.   DVT ppx:  SQ Lovenox Code Status: Full code Family Communication: Yes, patient's family, wife, sons and daughters at bed side Disposition Plan:  Anticipate discharge back to previous home environment Consults called: none Admission status: Obs / tele       Date of Service 04/16/2018    Lorretta Harp [facility] Pager [phone]  If 7PM-7AM, please contact night-coverage [email or web address] Password  [facility] 04/16/2018, 10:28 PM

## 2018-04-16 NOTE — ED Notes (Signed)
Pt unable to give UA at this time 

## 2018-04-17 ENCOUNTER — Observation Stay (HOSPITAL_BASED_OUTPATIENT_CLINIC_OR_DEPARTMENT_OTHER): Payer: Medicare Other

## 2018-04-17 ENCOUNTER — Encounter (HOSPITAL_COMMUNITY): Payer: Self-pay | Admitting: *Deleted

## 2018-04-17 ENCOUNTER — Observation Stay (HOSPITAL_COMMUNITY): Payer: Medicare Other

## 2018-04-17 DIAGNOSIS — R2689 Other abnormalities of gait and mobility: Secondary | ICD-10-CM | POA: Diagnosis not present

## 2018-04-17 DIAGNOSIS — R109 Unspecified abdominal pain: Secondary | ICD-10-CM | POA: Diagnosis not present

## 2018-04-17 DIAGNOSIS — N3289 Other specified disorders of bladder: Secondary | ICD-10-CM

## 2018-04-17 DIAGNOSIS — E7800 Pure hypercholesterolemia, unspecified: Secondary | ICD-10-CM | POA: Diagnosis not present

## 2018-04-17 DIAGNOSIS — K219 Gastro-esophageal reflux disease without esophagitis: Secondary | ICD-10-CM | POA: Diagnosis not present

## 2018-04-17 DIAGNOSIS — E7849 Other hyperlipidemia: Secondary | ICD-10-CM | POA: Diagnosis not present

## 2018-04-17 DIAGNOSIS — I10 Essential (primary) hypertension: Secondary | ICD-10-CM | POA: Diagnosis not present

## 2018-04-17 DIAGNOSIS — R55 Syncope and collapse: Secondary | ICD-10-CM

## 2018-04-17 DIAGNOSIS — I6523 Occlusion and stenosis of bilateral carotid arteries: Secondary | ICD-10-CM | POA: Diagnosis not present

## 2018-04-17 DIAGNOSIS — R2681 Unsteadiness on feet: Secondary | ICD-10-CM | POA: Diagnosis not present

## 2018-04-17 DIAGNOSIS — R32 Unspecified urinary incontinence: Secondary | ICD-10-CM | POA: Diagnosis not present

## 2018-04-17 DIAGNOSIS — Z8249 Family history of ischemic heart disease and other diseases of the circulatory system: Secondary | ICD-10-CM | POA: Diagnosis not present

## 2018-04-17 DIAGNOSIS — I25810 Atherosclerosis of coronary artery bypass graft(s) without angina pectoris: Secondary | ICD-10-CM | POA: Diagnosis not present

## 2018-04-17 DIAGNOSIS — M6281 Muscle weakness (generalized): Secondary | ICD-10-CM | POA: Diagnosis not present

## 2018-04-17 LAB — BASIC METABOLIC PANEL
Anion gap: 8 (ref 5–15)
BUN: 14 mg/dL (ref 6–20)
CALCIUM: 8.2 mg/dL — AB (ref 8.9–10.3)
CO2: 25 mmol/L (ref 22–32)
CREATININE: 1.05 mg/dL (ref 0.61–1.24)
Chloride: 105 mmol/L (ref 101–111)
GFR calc Af Amer: >60 mL/min (ref >60)
GFR calc non Af Amer: >60 mL/min (ref >60)
GLUCOSE: 102 mg/dL — AB (ref 65–99)
Potassium: 4 mmol/L (ref 3.5–5.1)
Sodium: 138 mmol/L (ref 135–145)

## 2018-04-17 LAB — CBC
HCT: 35.1 % — ABNORMAL LOW (ref 39.0–52.0)
Hemoglobin: 11.4 g/dL — ABNORMAL LOW (ref 13.0–17.0)
MCH: 32.3 pg (ref 26.0–34.0)
MCHC: 32.5 g/dL (ref 30.0–36.0)
MCV: 99.4 fL (ref 78.0–100.0)
PLATELETS: 188 10*3/uL (ref 150–400)
RBC: 3.53 MIL/uL — ABNORMAL LOW (ref 4.22–5.81)
RDW: 14.3 % (ref 11.5–15.5)
WBC: 4.1 10*3/uL (ref 4.0–10.5)

## 2018-04-17 LAB — RAPID URINE DRUG SCREEN, HOSP PERFORMED
Amphetamines: NOT DETECTED
BARBITURATES: NOT DETECTED
Benzodiazepines: NOT DETECTED
COCAINE: NOT DETECTED
Opiates: POSITIVE — AB
Tetrahydrocannabinol: NOT DETECTED

## 2018-04-17 LAB — GLUCOSE, CAPILLARY: GLUCOSE-CAPILLARY: 88 mg/dL (ref 65–99)

## 2018-04-17 MED ORDER — CAPSAICIN 0.025 % EX CREA
TOPICAL_CREAM | Freq: Two times a day (BID) | CUTANEOUS | Status: DC
  Administered 2018-04-17 – 2018-04-18 (×3): via TOPICAL
  Filled 2018-04-17: qty 60

## 2018-04-17 NOTE — Evaluation (Addendum)
Physical Therapy Evaluation Patient Details Name: Sean Kelley MRN: [id number] DOB: Apr 10, 1930 Today's Date: 04/17/2018   History of Present Illness  Sean Kelley is a 84 y.o. male with medical history significant of hypertension, hyperlipidemia, CAD, CABG, gastric ulcer, who presents with syncope, left flank pain. Per family, patient passed out at about 12 last night when he was sitting in front of computer.  Episode lasted for about 3 to 4 minutes.  Patient states that before he passed out, he felt hot and sweaty.  Denies any unilateral weakness, numbness or tingling his extremities.  No facial droop, slurred speech.  Denies any chest pain, shortness of breath, cough.  No fever or chills.  Patient does not have nausea, vomiting, diarrhea, abdominal pain, symptoms of UTI.  Patient reports left flank pain, which is constant, sharp, reached up to 10 out of 10 severity, nonradiating.  Denies any injury recently.  No hematuria.  Per report, patient had bradycardia.  Patient had oxygen desaturation to 81% on room air per family, but his oxygen saturation 98% on room air now.    Clinical Impression  Patient evaluated by Physical Therapy with no further acute PT needs identified. All education has been completed and the patient has no further questions. Patient ambulated without assistive device with supervision for safety only in unfamiliar busy hospital hallways. One very minor LOB when walking and looking to side to speak to therapist; recovered balance independently. Patient would benefit from ambulation with nursing while in current venue. See below for any follow-up Physical Therapy or equipment needs. PT is signing off. Thank you for this referral.     Follow Up Recommendations No PT follow up    Equipment Recommendations  None recommended by PT    Recommendations for Other Services       Precautions / Restrictions Restrictions Weight Bearing Restrictions: No      Mobility  Bed  Mobility Overal bed mobility: Independent                Transfers Overall transfer level: Independent Equipment used: None                Ambulation/Gait Ambulation/Gait assistance: Supervision(for safety in busy hallways) Ambulation Distance (Feet): 350 Feet Assistive device: None Gait Pattern/deviations: Step-through pattern     General Gait Details: good speed, safety, and balance; hallways were busy and crowded; very mild LOB recovering independently while trying to look to side to speak with therapist  Stairs            Wheelchair Mobility    Modified Rankin (Stroke Patients Only)       Balance Overall balance assessment: Mild deficits observed, not formally tested                                           Pertinent Vitals/Pain Pain Assessment: 0-10 Pain Score: 6     Home Living Family/patient expects to be discharged to:: Private residence Living Arrangements: Spouse/significant other Available Help at Discharge: Family Type of Home: House Home Access: Level entry     Home Layout: Two level;Laundry or work area in basement;Able to live on main level with bedroom/bathroom Home Equipment: Gilmer Mor - single point;Walker - 2 wheels;Grab bars - tub/shower      Prior Function Level of Independence: Independent  Hand Dominance        Extremity/Trunk Assessment   Upper Extremity Assessment Upper Extremity Assessment: Overall WFL for tasks assessed    Lower Extremity Assessment Lower Extremity Assessment: Overall WFL for tasks assessed       Communication   Communication: HOH  Cognition Arousal/Alertness: Awake/alert Behavior During Therapy: WFL for tasks assessed/performed Overall Cognitive Status: Within Functional Limits for tasks assessed                                        General Comments      Exercises     Assessment/Plan    PT Assessment Patent does not need any  further PT services  PT Problem List         PT Treatment Interventions      PT Goals (Current goals can be found in the Care Plan section)  Acute Rehab PT Goals Patient Stated Goal: figure out why left side hurts PT Goal Formulation: With patient Time For Goal Achievement: 04/24/18    Frequency     Barriers to discharge        Co-evaluation               AM-PAC PT "6 Clicks" Daily Activity  Outcome Measure Difficulty turning over in bed (including adjusting bedclothes, sheets and blankets)?: None Difficulty moving from lying on back to sitting on the side of the bed? : None Difficulty sitting down on and standing up from a chair with arms (e.g., wheelchair, bedside commode, etc,.)?: None Help needed moving to and from a bed to chair (including a wheelchair)?: None Help needed walking in hospital room?: None Help needed climbing 3-5 steps with a railing? : A Little 6 Click Score: 23    End of Session   Activity Tolerance: Patient tolerated treatment well Patient left: in bed;with family/visitor present   PT Visit Diagnosis: Unsteadiness on feet (R26.81)    Time: 6578-4696 PT Time Calculation (min) (ACUTE ONLY): 24 min   Charges:   PT Evaluation $PT Eval Low Complexity: 1 Low PT Treatments $Gait Training: 8-22 mins   PT G Codes:        Sean Bischoff D. Hartnett-Rands, MS, PT [facility] PT [facility] [place] [phone] 04/17/2018, 10:42 AM

## 2018-04-17 NOTE — Progress Notes (Signed)
Preliminary notes--Bilateral carotid duplex exam completed. Bilateral vertebral arteries antegrade flow.  1-39% ICA stenosis bilaterally based on the category of velocity.   Hongying Burrell Hodapp (RDMS RVT) 04/17/18 3:48 PM

## 2018-04-17 NOTE — Progress Notes (Signed)
Progress Note    Sean Kelley  ZOX:[id number] DOB: [birth year]  DOA: 04/16/2018 PCP: Renford Dills, MD    Brief Narrative:    Medical records reviewed and are as summarized below:  Sean Kelley is an 84 y.o. male with medical history significant of hypertension, hyperlipidemia, CAD, CABG, gastric ulcer, who presents with syncope, left flank pain.  Per family, patient passed out at about 12 last night when he was sitting in front of computer.  Episode lasted for about 3 to 4 minutes.  Patient states that before he passed out, he felt hot and sweaty.      Assessment/Plan:   Principal Problem:   Syncope Active Problems:   HTN (hypertension)   CAD (coronary artery disease) of bypass graft   HLD (hyperlipidemia)   Left flank pain   Bladder mass  Syncope:  -just had 2D echo on 03/24/2018 which showed EF seated at 65% with diastolic dysfunction as well as a normal stress test - Carotid doppler, MRI-brain pending from admitter - PT/OT eval and treat - no orthostatics done before IVF  Essential hypertension: -d/c HCTZ-- suspect will not need  CAD (coronary artery disease) of bypass graft: No troponin negative x2.  Had a negative stress test in April 2019. -Continue aspirin, Zocor  HLD:  -zocor  Left flank pain:  -CT angiogram did not show stone.  Patient does not have hematuria. -? muscle strain. -improved with robaxin  Bladder mass: Etiology is not clear.  Incidental finding -outpatient follow up   Body mass index is 26.91 kg/m.   Family Communication/Anticipated D/C date and plan/Code Status   DVT prophylaxis: Lovenox ordered. Code Status: Full Code.  Family Communication:  Disposition Plan: home after MRI/carotids   Medical Consultants:    None.     Subjective:   No further overnight issues-- was very active Saturday outside in the heat of the day  Objective:    Vitals:   04/17/18 0001 04/17/18 0539 04/17/18 0940 04/17/18 1049    BP: (!) 120/55 (!) 118/55 (!) 142/62   Pulse: (!) 54 (!) 51 (!) 58   Resp: 18 18    Temp: 97.6 F (36.4 C) 98.4 F (36.9 C)  97.9 F (36.6 C)  TempSrc: Oral Oral  Oral  SpO2: 95% 94% 97% 98%  Weight:  80.3 kg (177 lb)    Height:        Intake/Output Summary (Last 24 hours) at 04/17/2018 1313 Last data filed at 04/17/2018 0900 Gross per 24 hour  Intake 1240 ml  Output 1280 ml  Net -40 ml   Filed Weights   04/16/18 0757 04/16/18 1814 04/17/18 0539  Weight: 80.7 kg (178 lb) 80.6 kg (177 lb 12.8 oz) 80.3 kg (177 lb)    Exam: In bed, NAD No increased work of breathing rrr Moves all 4 ext No LE edema  Data Reviewed:   I have personally reviewed following labs and imaging studies:  Labs: Labs show the following:   Basic Metabolic Panel: Recent Labs  Lab 04/16/18 0909 04/17/18 0618  NA 138 138  K 4.0 4.0  CL 108 105  CO2 24 25  GLUCOSE 117* 102*  BUN 24* 14  CREATININE 1.07 1.05  CALCIUM 7.9* 8.2*   GFR Estimated Creatinine Clearance: 48.9 mL/min (by C-G formula based on SCr of 1.05 mg/dL). Liver Function Tests: Recent Labs  Lab 04/16/18 0909  AST 22  ALT 16*  ALKPHOS 61  BILITOT 0.2*  PROT 6.1*  ALBUMIN 3.5   Recent Labs  Lab 04/16/18 0909  LIPASE 28   No results for input(s): AMMONIA in the last 168 hours. Coagulation profile No results for input(s): INR, PROTIME in the last 168 hours.  CBC: Recent Labs  Lab 04/16/18 0818 04/17/18 0618  WBC 6.5 4.1  NEUTROABS 3.5  --   HGB 13.2 11.4*  HCT 38.6* 35.1*  MCV 98.2 99.4  PLT 191 188   Cardiac Enzymes: Recent Labs  Lab 04/16/18 0818 04/16/18 1230  TROPONINI <0.03 <0.03   BNP (last 3 results) No results for input(s): PROBNP in the last 8760 hours. CBG: Recent Labs  Lab 04/17/18 0538  GLUCAP 88   D-Dimer: No results for input(s): DDIMER in the last 72 hours. Hgb A1c: No results for input(s): HGBA1C in the last 72 hours. Lipid Profile: No results for input(s): CHOL, HDL,  LDLCALC, TRIG, CHOLHDL, LDLDIRECT in the last 72 hours. Thyroid function studies: Recent Labs    04/16/18 1402  TSH 0.981   Anemia work up: No results for input(s): VITAMINB12, FOLATE, FERRITIN, TIBC, IRON, RETICCTPCT in the last 72 hours. Sepsis Labs: Recent Labs  Lab 04/16/18 0818 04/17/18 0618  WBC 6.5 4.1    Microbiology No results found for this or any previous visit (from the past 240 hour(s)).  Procedures and diagnostic studies:  Dg Chest 2 View  Result Date: 04/16/2018 CLINICAL DATA:  Dizziness with diaphoresis beginning last night with urinary incontinence. Left flank pain. EXAM: CHEST - 2 VIEW COMPARISON:  06/04/2014 FINDINGS: Lungs are adequately inflated with moderate elevation the right hemidiaphragm unchanged. No focal airspace consolidation or effusion. Cardiomediastinal silhouette and remainder of the exam is unchanged. IMPRESSION: No active cardiopulmonary disease. Electronically Signed   By: Elberta Fortis M.D.   On: 04/16/2018 08:50   Ct Angio Chest/abd/pel For Dissection W And/or Wo Contrast  Result Date: 04/16/2018 CLINICAL DATA:  Left flank pain 1 day. Pain began after syncopal episode last night. EXAM: CT ANGIOGRAPHY CHEST, ABDOMEN AND PELVIS TECHNIQUE: Multidetector CT imaging through the chest, abdomen and pelvis was performed using the standard protocol during bolus administration of intravenous contrast. Multiplanar reconstructed images and MIPs were obtained and reviewed to evaluate the vascular anatomy. CONTRAST:  ISOVUE-370 IOPAMIDOL (ISOVUE-370) INJECTION 76% COMPARISON:  Chest CT 02/19/2012 FINDINGS: CTA CHEST FINDINGS Cardiovascular: Heart is normal size. Thoracic aorta is normal in caliber without evidence of aneurysm or dissection. Minimal calcified plaque over the thoracic aorta. Normal takeoff of the great vessels. Mild noncalcified plaque over the anterior aspect of the right brachiocephalic artery. Pulmonary arterial system is well opacified  without emboli. Mediastinum/Nodes: No evidence of mediastinal or hilar adenopathy. Remaining mediastinal structures are normal. Lungs/Pleura: Lungs are adequately inflated without focal consolidation or effusion. There is elevation of the right hemidiaphragm. Airways are normal. Musculoskeletal: Mediastinum wires are present. Minimal degenerate change of the spine. Review of the MIP images confirms the above findings. CTA ABDOMEN AND PELVIS FINDINGS VASCULAR Aorta: Abdominal aorta is normal in caliber without aneurysm or dissection. There is mild calcified plaque throughout the abdominal aorta. Celiac: There is a diminutive celiac axis with moderate focal stenosis at the origin. SMA: Patent. Renals: Single bilateral renal arteries are patent with minimal calcified plaque at the origin of the left renal artery. IMA: Patent. Inflow: Calcified plaque throughout the iliac arteries without evidence of focal stenosis or occlusion. Veins: Unremarkable. Review of the MIP images confirms the above findings. NON-VASCULAR Hepatobiliary: Within normal. Pancreas: Normal. Spleen: Absent. Adrenals/Urinary Tract: Adrenal  glands are within normal. Kidneys normal size without hydronephrosis or nephrolithiasis. 1.7 cm cyst over the mid to lower pole left kidney. Ureters are within normal. Rounded 2.3 cm density projecting over the posterior bladder wall which may represent an intrinsic bladder mass versus impression from nodular adjacent prostate gland. Stomach/Bowel: Stomach is within normal. There are a couple mildly dilated fluid-filled proximal jejunal loops with gradual transition to normal caliber small bowel. Appendix is normal. Colon is normal. Lymphatic: No adenopathy. Reproductive: Possible nodular prostatic projection towards the bladder base versus intrinsic bladder mass as described above. Other: No free fluid or focal inflammatory change. Musculoskeletal: Mild degenerate change of the spine. Review of the MIP images  confirms the above findings. IMPRESSION: No evidence of aneurysm or dissection involving the thoracoabdominal aorta. Couple mildly dilated proximal jejunal loops with gradual transition to normal caliber small bowel likely within normal. Otherwise, no acute findings in the chest, abdomen or pelvis. 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 Mild atherosclerotic disease of the thoracoabdominal aorta and iliac arteries. 2.3 cm nodular focus projecting over the posterior bladder base which may represent intrinsic bladder mass versus nodular projection from the adjacent prostate gland. Urology protocol CT may be helpful for further evaluation. 1.7 cm left renal cyst. Electronically Signed   By: Elberta Fortis M.D.   On: 04/16/2018 13:07    Medications:   . aspirin EC  81 mg Oral Daily  . enoxaparin (LOVENOX) injection  40 mg Subcutaneous Q24H  . simvastatin  20 mg Oral QHS  . sodium chloride flush  3 mL Intravenous Q12H  . sodium chloride flush  3 mL Intravenous Q12H  . sodium chloride flush  3 mL Intravenous Q12H   Continuous Infusions: . sodium chloride 75 mL/hr at 04/17/18 1004  . sodium chloride       LOS: 0 days   Joseph Art  [facility]   *Please refer to [email or web address], password [email or web address] to get updated schedule on who will round on this patient, as hospitalists switch teams weekly. If 7PM-7AM, please contact night-coverage at [email or web address], password [email or web address] for any overnight needs.  04/17/2018, 1:13 PM

## 2018-04-17 NOTE — Evaluation (Signed)
Occupational Therapy Evaluation Patient Details Name: Sean Kelley MRN: [id number] DOB: June 13, 1930 Today's Date: 04/17/2018    History of Present Illness 84 y.o. male with medical history significant ofhypertension, hyperlipidemia, CAD, CABG, gastric ulcer, who presents with syncope, left flank pain.   Clinical Impression   PTA, pt was living with his wife and was independent. Pt currently requiring supervision for ADLs and functional mobility. Pt presenting with decreased ROM and occupational performance due to pain at left flank. Providing pt with education on compensatory techniques for LB ADLs, toileting, and bed mobility. Pt demonstrating and verbalizing understanding. All questions answered and education provided. Recommend dc home once medically stable per physician. All acute OT needs met and will sign off. Thank you.     Follow Up Recommendations  No OT follow up;Supervision - Intermittent    Equipment Recommendations  None recommended by OT    Recommendations for Other Services       Precautions / Restrictions Restrictions Weight Bearing Restrictions: No      Mobility Bed Mobility Overal bed mobility: Needs Assistance Bed Mobility: Supine to Sit     Supine to sit: Min assist     General bed mobility comments: Min A to pull trunk into seated position. Pt attempting log roll technique but stating it hurts too much.   Transfers Overall transfer level: Needs assistance Equipment used: None Transfers: Sit to/from Stand Sit to Stand: Supervision         General transfer comment: supervision for safety    Balance Overall balance assessment: Mild deficits observed, not formally tested                                         ADL either performed or assessed with clinical judgement   ADL Overall ADL's : Needs assistance/impaired Eating/Feeding: Set up;Sitting   Grooming: Supervision/safety;Standing;Wash/dry hands   Upper Body Bathing:  Supervision/ safety;Sitting   Lower Body Bathing: Supervison/ safety;Sit to/from stand Lower Body Bathing Details (indicate cue type and reason): Educating pt on techniques for LB bathing to decrease pain of left side Upper Body Dressing : Supervision/safety;Sitting   Lower Body Dressing: Supervision/safety;Sit to/from stand Lower Body Dressing Details (indicate cue type and reason): Educating pt on LB dressing compensatory techniques to decrease pain. Pt donning/doffing socks by bringing ankles to knees and using a one handed technique Toilet Transfer: Supervision/safety;Regular Toilet;Ambulation   Toileting- Clothing Manipulation and Hygiene: Supervision/safety;Sit to/from stand       Functional mobility during ADLs: Supervision/safety General ADL Comments: Pt with limited ROM due to pain at left side of abdomen with certain movements (bending and twisting). Educating pt on compensaotry techniques for LB ADLs, bed mobility, and toileting.     Vision         Perception     Praxis      Pertinent Vitals/Pain Pain Assessment: Faces Faces Pain Scale: Hurts even more Pain Location: Left side of abdomen Pain Descriptors / Indicators: Constant;Discomfort;Grimacing;Shooting Pain Intervention(s): Monitored during session;Repositioned;Limited activity within patient's tolerance     Hand Dominance Right   Extremity/Trunk Assessment Upper Extremity Assessment Upper Extremity Assessment: Overall WFL for tasks assessed   Lower Extremity Assessment Lower Extremity Assessment: Overall WFL for tasks assessed   Cervical / Trunk Assessment Cervical / Trunk Assessment: Other exceptions Cervical / Trunk Exceptions: Left abdomen pain preventing certain trunk movements   Communication Communication Communication: HOH   Cognition  Arousal/Alertness: Awake/alert Behavior During Therapy: WFL for tasks assessed/performed Overall Cognitive Status: Within Functional Limits for tasks assessed                                      General Comments  Wife present during session    Exercises     Shoulder Instructions      Home Living Family/patient expects to be discharged to:: Private residence Living Arrangements: Spouse/significant other Available Help at Discharge: Family Type of Home: House Home Access: Level entry     Home Layout: Two level;Laundry or work area in basement;Able to live on main level with bedroom/bathroom Alternate Level Stairs-Number of Steps: 10 stairs to basement with handrail on right   Bathroom Shower/Tub: Teacher, early years/pre: Standard     Home Equipment: Cane - single point;Walker - 2 wheels;Grab bars - tub/shower          Prior Functioning/Environment Level of Independence: Independent        Comments: ADLs and IADLs        OT Problem List: Decreased range of motion;Decreased activity tolerance;Impaired balance (sitting and/or standing);Pain      OT Treatment/Interventions:      OT Goals(Current goals can be found in the care plan section) Acute Rehab OT Goals Patient Stated Goal: figure out why left side hurts OT Goal Formulation: All assessment and education complete, DC therapy  OT Frequency:     Barriers to D/C:            Co-evaluation              AM-PAC PT "6 Clicks" Daily Activity     Outcome Measure Help from another person eating meals?: None Help from another person taking care of personal grooming?: None Help from another person toileting, which includes using toliet, bedpan, or urinal?: None Help from another person bathing (including washing, rinsing, drying)?: A Little Help from another person to put on and taking off regular upper body clothing?: None Help from another person to put on and taking off regular lower body clothing?: A Little 6 Click Score: 22   End of Session Nurse Communication: Mobility status  Activity Tolerance: Patient tolerated treatment  well Patient left: in chair;with family/visitor present(with transport team)  OT Visit Diagnosis: Unsteadiness on feet (R26.81);Other abnormalities of gait and mobility (R26.89);Muscle weakness (generalized) (M62.81);Pain Pain - Right/Left: Left Pain - part of body: (Abdomen)                Time: 9509-3267 OT Time Calculation (min): 14 min Charges:  OT General Charges $OT Visit: 1 Visit OT Evaluation $OT Eval Low Complexity: 1 Low G-Codes:     Ethal Gotay MSOT, OTR/L Acute Rehab Pager: [phone] Office: [place] 04/17/2018, 3:34 PM

## 2018-04-18 DIAGNOSIS — N3289 Other specified disorders of bladder: Secondary | ICD-10-CM | POA: Diagnosis not present

## 2018-04-18 DIAGNOSIS — R109 Unspecified abdominal pain: Secondary | ICD-10-CM | POA: Diagnosis not present

## 2018-04-18 DIAGNOSIS — R55 Syncope and collapse: Secondary | ICD-10-CM | POA: Diagnosis not present

## 2018-04-18 DIAGNOSIS — I159 Secondary hypertension, unspecified: Secondary | ICD-10-CM | POA: Diagnosis not present

## 2018-04-18 LAB — GLUCOSE, CAPILLARY: Glucose-Capillary: 92 mg/dL (ref 65–99)

## 2018-04-18 MED ORDER — LISINOPRIL-HYDROCHLOROTHIAZIDE 20-12.5 MG PO TABS: 1.0000 | ORAL_TABLET | Freq: Every day | ORAL | Status: DC

## 2018-04-18 MED ORDER — LISINOPRIL 20 MG PO TABS
20.0000 mg | ORAL_TABLET | Freq: Every day | ORAL | Status: DC
  Administered 2018-04-18: 20 mg via ORAL
  Filled 2018-04-18: qty 1

## 2018-04-18 MED ORDER — HYDROCHLOROTHIAZIDE 12.5 MG PO CAPS
12.5000 mg | ORAL_CAPSULE | Freq: Every day | ORAL | Status: DC
  Administered 2018-04-18: 12.5 mg via ORAL
  Filled 2018-04-18: qty 1

## 2018-04-18 MED ORDER — CAPSAICIN 0.025 % EX CREA: TOPICAL_CREAM | Freq: Two times a day (BID) | CUTANEOUS | 0 refills | Status: DC

## 2018-04-18 NOTE — Discharge Summary (Signed)
Physician Discharge Summary  CRIMSON BEER ZOX:[id number] DOB: [birth year] DOA: 04/16/2018  PCP: Renford Dills, MD  Admit date: 04/16/2018 Discharge date: 04/18/2018  Admitted From: home Discharge disposition: home   Recommendations for Outpatient Follow-Up:   1. Message sent to Dr. Katrinka Blazing for consideration of stopping HCTZ (patinet works outside in the heat and does not drink much fluid 2. ?event monitor 3. outpateint urology follow up for bladder mass   Discharge Diagnosis:   Principal Problem:   Syncope Active Problems:   HTN (hypertension)   CAD (coronary artery disease) of bypass graft   HLD (hyperlipidemia)   Left flank pain   Bladder mass    Discharge Condition: Improved.  Diet recommendation: Low sodium, heart healthy  Wound care: None.  Code status: Full.   History of Present Illness:  Sean Kelley is an 84 y.o. male with medical history significant ofhypertension, hyperlipidemia, CAD, CABG, gastric ulcer, who presents with syncope, left flank pain.  Per family, patient passed out at about 12 last night when he was sitting in front of computer. Episode lasted for about 3 to 4 minutes. Patient states that before he passed out, he felt hot and sweaty.    Hospital Course by Problem:   Syncope: -just had 2D echo on 03/24/2018 which showed EF seated at 65% with diastolic dysfunction as well as a normal stress test - MRI normal Carotids normal - PT/OT--home - no orthostatics done before IVF -I suspect patient became dehydrated when working outside for multiple hours in the 90 degree weather  Essential hypertension: -refused to stop HCTZ-- see above, may not need  CAD (coronary artery disease) of bypass graft: Notroponin negative x2. Had a negative stress test in April 2019. -Continue aspirin, Zocor  HLD:  -zocor  Left flank pain: -? muscle strain. -improved with stretching  Bladder mass: Etiology is not clear. Incidental  finding -outpatient follow up   Medical Consultants:      Discharge Exam:   Vitals:   04/18/18 0425 04/18/18 0827  BP: (!) 124/51 (!) 161/69  Pulse: (!) 51 (!) 57  Resp:  18  Temp: 98.2 F (36.8 C) 98.7 F (37.1 C)  SpO2: 94% 96%   Vitals:   04/17/18 2247 04/18/18 0425 04/18/18 0551 04/18/18 0827  BP: (!) 120/55 (!) 124/51  (!) 161/69  Pulse: (!) 55 (!) 51  (!) 57  Resp:    18  Temp: 98.3 F (36.8 C) 98.2 F (36.8 C)  98.7 F (37.1 C)  TempSrc: Oral Oral  Oral  SpO2: 100% 94%  96%  Weight:   80.4 kg (177 lb 4.8 oz)   Height:        General exam: Appears calm and comfortable.-- pleasant and cooperative-- walking the halls and anxious to be d/c'd.    The results of significant diagnostics from this hospitalization (including imaging, microbiology, ancillary and laboratory) are listed below for reference.     Procedures and Diagnostic Studies:   Dg Chest 2 View  Result Date: 04/16/2018 CLINICAL DATA:  Dizziness with diaphoresis beginning last night with urinary incontinence. Left flank pain. EXAM: CHEST - 2 VIEW COMPARISON:  06/04/2014 FINDINGS: Lungs are adequately inflated with moderate elevation the right hemidiaphragm unchanged. No focal airspace consolidation or effusion. Cardiomediastinal silhouette and remainder of the exam is unchanged. IMPRESSION: No active cardiopulmonary disease. Electronically Signed   By: Elberta Fortis M.D.   On: 04/16/2018 08:50   Mr Brain Wo Contrast  Result Date: 04/17/2018  CLINICAL DATA:  Syncope. History of hypertension, hypercholesterolemia. EXAM: MRI HEAD WITHOUT CONTRAST TECHNIQUE: Multiplanar, multiecho pulse sequences of the brain and surrounding structures were obtained without intravenous contrast. COMPARISON:  None. FINDINGS: INTRACRANIAL CONTENTS: No reduced diffusion to suggest acute ischemia. No susceptibility artifact to suggest hemorrhage. The ventricles and sulci are normal for patient's age. Faint supratentorial white  matter FLAIR T2 hyperintensities compatible with early chronic small vessel ischemic changes, less than expected for age. No suspicious parenchymal signal, masses, mass effect. No abnormal extra-axial fluid collections. No extra-axial masses. VASCULAR: Normal major intracranial vascular flow voids present at skull base. SKULL AND UPPER CERVICAL SPINE: No abnormal sellar expansion. No suspicious calvarial bone marrow signal. Craniocervical junction maintained. SINUSES/ORBITS: LEFT maxillary mucosal retention cyst. Trace paranasal sinus mucosal thickening. Mastoid air cells are well aerated.The included ocular globes and orbital contents are non-suspicious. OTHER: None. IMPRESSION: Normal noncontrast MRI of the head for age. Electronically Signed   By: Awilda Metro M.D.   On: 04/17/2018 20:13   Ct Angio Chest/abd/pel For Dissection W And/or Wo Contrast  Result Date: 04/16/2018 CLINICAL DATA:  Left flank pain 1 day. Pain began after syncopal episode last night. EXAM: CT ANGIOGRAPHY CHEST, ABDOMEN AND PELVIS TECHNIQUE: Multidetector CT imaging through the chest, abdomen and pelvis was performed using the standard protocol during bolus administration of intravenous contrast. Multiplanar reconstructed images and MIPs were obtained and reviewed to evaluate the vascular anatomy. CONTRAST:  ISOVUE-370 IOPAMIDOL (ISOVUE-370) INJECTION 76% COMPARISON:  Chest CT 02/19/2012 FINDINGS: CTA CHEST FINDINGS Cardiovascular: Heart is normal size. Thoracic aorta is normal in caliber without evidence of aneurysm or dissection. Minimal calcified plaque over the thoracic aorta. Normal takeoff of the great vessels. Mild noncalcified plaque over the anterior aspect of the right brachiocephalic artery. Pulmonary arterial system is well opacified without emboli. Mediastinum/Nodes: No evidence of mediastinal or hilar adenopathy. Remaining mediastinal structures are normal. Lungs/Pleura: Lungs are adequately inflated without focal  consolidation or effusion. There is elevation of the right hemidiaphragm. Airways are normal. Musculoskeletal: Mediastinum wires are present. Minimal degenerate change of the spine. Review of the MIP images confirms the above findings. CTA ABDOMEN AND PELVIS FINDINGS VASCULAR Aorta: Abdominal aorta is normal in caliber without aneurysm or dissection. There is mild calcified plaque throughout the abdominal aorta. Celiac: There is a diminutive celiac axis with moderate focal stenosis at the origin. SMA: Patent. Renals: Single bilateral renal arteries are patent with minimal calcified plaque at the origin of the left renal artery. IMA: Patent. Inflow: Calcified plaque throughout the iliac arteries without evidence of focal stenosis or occlusion. Veins: Unremarkable. Review of the MIP images confirms the above findings. NON-VASCULAR Hepatobiliary: Within normal. Pancreas: Normal. Spleen: Absent. Adrenals/Urinary Tract: Adrenal glands are within normal. Kidneys normal size without hydronephrosis or nephrolithiasis. 1.7 cm cyst over the mid to lower pole left kidney. Ureters are within normal. Rounded 2.3 cm density projecting over the posterior bladder wall which may represent an intrinsic bladder mass versus impression from nodular adjacent prostate gland. Stomach/Bowel: Stomach is within normal. There are a couple mildly dilated fluid-filled proximal jejunal loops with gradual transition to normal caliber small bowel. Appendix is normal. Colon is normal. Lymphatic: No adenopathy. Reproductive: Possible nodular prostatic projection towards the bladder base versus intrinsic bladder mass as described above. Other: No free fluid or focal inflammatory change. Musculoskeletal: Mild degenerate change of the spine. Review of the MIP images confirms the above findings. IMPRESSION: No evidence of aneurysm or dissection involving the thoracoabdominal aorta. Couple mildly  dilated proximal jejunal loops with gradual transition to  normal caliber small bowel likely within normal. Otherwise, no acute findings in the chest, abdomen or pelvis. 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 Mild atherosclerotic disease of the thoracoabdominal aorta and iliac arteries. 2.3 cm nodular focus projecting over the posterior bladder base which may represent intrinsic bladder mass versus nodular projection from the adjacent prostate gland. Urology protocol CT may be helpful for further evaluation. 1.7 cm left renal cyst. Electronically Signed   By: Elberta Fortis M.D.   On: 04/16/2018 13:07     Labs:   Basic Metabolic Panel: Recent Labs  Lab 04/16/18 0909 04/17/18 0618  NA 138 138  K 4.0 4.0  CL 108 105  CO2 24 25  GLUCOSE 117* 102*  BUN 24* 14  CREATININE 1.07 1.05  CALCIUM 7.9* 8.2*   GFR Estimated Creatinine Clearance: 48.9 mL/min (by C-G formula based on SCr of 1.05 mg/dL). Liver Function Tests: Recent Labs  Lab 04/16/18 0909  AST 22  ALT 16*  ALKPHOS 61  BILITOT 0.2*  PROT 6.1*  ALBUMIN 3.5   Recent Labs  Lab 04/16/18 0909  LIPASE 28   No results for input(s): AMMONIA in the last 168 hours. Coagulation profile No results for input(s): INR, PROTIME in the last 168 hours.  CBC: Recent Labs  Lab 04/16/18 0818 04/17/18 0618  WBC 6.5 4.1  NEUTROABS 3.5  --   HGB 13.2 11.4*  HCT 38.6* 35.1*  MCV 98.2 99.4  PLT 191 188   Cardiac Enzymes: Recent Labs  Lab 04/16/18 0818 04/16/18 1230  TROPONINI <0.03 <0.03   BNP: Invalid input(s): POCBNP CBG: Recent Labs  Lab 04/17/18 0538 04/18/18 0737  GLUCAP 88 92   D-Dimer No results for input(s): DDIMER in the last 72 hours. Hgb A1c No results for input(s): HGBA1C in the last 72 hours. Lipid Profile No results for input(s): CHOL, HDL, LDLCALC, TRIG, CHOLHDL, LDLDIRECT in the last 72 hours. Thyroid function studies Recent Labs    04/16/18 1402  TSH 0.981   Anemia work up No results for input(s): VITAMINB12, FOLATE, FERRITIN, TIBC,  IRON, RETICCTPCT in the last 72 hours. Microbiology No results found for this or any previous visit (from the past 240 hour(s)).   Discharge Instructions:   Discharge Instructions    Diet - low sodium heart healthy   Complete by:  As directed    Discharge instructions   Complete by:  As directed    Stay hydrated -message sent to Dr. Katrinka Blazing regarding event monitor -PCP follow up for bladder/prostate finding on CT scan   Increase activity slowly   Complete by:  As directed    Nursing communication   Complete by:  As directed    Please send cream home with patient     Allergies as of 04/18/2018      Reactions   Penicillins Rash      Medication List    TAKE these medications   aspirin EC 81 MG tablet Take 81 mg by mouth daily.   capsaicin 0.025 % cream Commonly known as:  ZOSTRIX Apply topically 2 (two) times daily.   lisinopril-hydrochlorothiazide 20-12.5 MG tablet Commonly known as:  PRINZIDE,ZESTORETIC Take 1 tablet by mouth daily.   potassium chloride 10 MEQ tablet Commonly known as:  K-DUR,KLOR-CON Take 10 mEq by mouth daily.   simvastatin 20 MG tablet Commonly known as:  ZOCOR Take 20 mg by mouth at bedtime.   tamsulosin 0.4 MG  Caps capsule Commonly known as:  FLOMAX Take 0.4 mg by mouth at bedtime.      Follow-up Information    Renford Dills, MD Follow up in 1 week(s).   Specialty:  Internal Medicine Contact information: [address] [place] [address] [phone]        Lyn Records, MD .   Specialty:  Cardiology Contact information: [phone] [address] [place] [address] [phone]            Time coordinating discharge: 35 min  Signed:  Joseph Art  [facility] 04/18/2018, 1:37 PM

## 2018-04-18 NOTE — Progress Notes (Signed)
Discharge patient home with family.  Discussed follow up appointments and medications.  Sent instructions home with patient.

## 2018-04-25 ENCOUNTER — Telehealth: Payer: Self-pay | Admitting: *Deleted

## 2018-04-25 DIAGNOSIS — R55 Syncope and collapse: Secondary | ICD-10-CM

## 2018-04-25 NOTE — Telephone Encounter (Signed)
Spoke with pt and he is agreeable to have event monitor placed.  Advised I will place order and have scheduling dept reach out to pt to get him scheduled.  Pt appreciative for call.

## 2018-04-25 NOTE — Telephone Encounter (Signed)
-----   Message from Lyn Records, MD sent at 04/22/2018 10:21 AM EDT ----- Thanks for note.  We will schedule 30 day monitor and follow. ----- Message ----- From: Joseph Art, DO Sent: 04/18/2018   1:37 PM To: Lyn Records, MD  Dr. Katrinka Blazing- Patient had a reported syncopal event--- was working outside and I suspect was dehydrated (no orthostatics)-- we discussed holding HCTZ and trying a different medication but he wanted to defer to you.   ?30 day event monitor to r/o arrythmia as well? Thanks, Shanda Bumps

## 2018-04-26 ENCOUNTER — Encounter (INDEPENDENT_AMBULATORY_CARE_PROVIDER_SITE_OTHER): Payer: Medicare Other

## 2018-04-26 DIAGNOSIS — R0789 Other chest pain: Secondary | ICD-10-CM | POA: Diagnosis not present

## 2018-04-26 DIAGNOSIS — R935 Abnormal findings on diagnostic imaging of other abdominal regions, including retroperitoneum: Secondary | ICD-10-CM | POA: Diagnosis not present

## 2018-04-26 DIAGNOSIS — R55 Syncope and collapse: Secondary | ICD-10-CM

## 2018-04-26 DIAGNOSIS — N3289 Other specified disorders of bladder: Secondary | ICD-10-CM | POA: Diagnosis not present

## 2018-05-16 ENCOUNTER — Ambulatory Visit (INDEPENDENT_AMBULATORY_CARE_PROVIDER_SITE_OTHER): Payer: Medicare Other | Admitting: Physician Assistant

## 2018-05-16 ENCOUNTER — Encounter: Payer: Self-pay | Admitting: Physician Assistant

## 2018-05-16 VITALS — BP 130/54 | HR 63 | Ht 68.0 in | Wt 176.0 lb

## 2018-05-16 DIAGNOSIS — R55 Syncope and collapse: Secondary | ICD-10-CM

## 2018-05-16 DIAGNOSIS — I25810 Atherosclerosis of coronary artery bypass graft(s) without angina pectoris: Secondary | ICD-10-CM

## 2018-05-16 DIAGNOSIS — I2510 Atherosclerotic heart disease of native coronary artery without angina pectoris: Secondary | ICD-10-CM

## 2018-05-16 DIAGNOSIS — I10 Essential (primary) hypertension: Secondary | ICD-10-CM

## 2018-05-16 NOTE — Progress Notes (Signed)
Cardiology Office Note:    Date:  05/16/2018   ID:  Sean Kelley, DOB 08/16/1930, MRN [id number]  PCP:  Renford Dills, MD  Cardiologist:  Lesleigh Noe, MD   Referring MD: Renford Dills, MD   Chief Complaint  Patient presents with  . Hospitalization Follow-up    Syncope    History of Present Illness:    Sean Kelley is a 84 y.o. male with coronary artery disease status post CABG in 2009, hypertension, hyperlipidemia, peptic ulcer disease.  Last seen in clinic by Berton Bon, NP in 02/2018.  Patient complained of shortness of breath and also was noted to have a murmur.  Stress testing and echocardiogram was arranged.  Echocardiogram demonstrated normal LV function.  Stress test demonstrated no scar or ischemia.  He was admitted 5/19-5/21 with syncope.  Brain MRI was normal.  Carotid ultrasound demonstrated minimal bilateral plaque.  Dr. Katrinka Blazing was contacted at discharge and event monitor was arranged.  Sean Kelley returns for follow-up.  He is here alone.  He has done well since discharge from the hospital.  He denies recurrent syncope.  He denies chest discomfort.  He notes dyspnea on occasion.  This is overall unchanged.  He denies PND, edema.  He denies any bleeding issues.  He was noted to have left flank pain in the hospital.  This is slowly improved since discharge.  It is felt to be a musculoskeletal injury.  Prior CV studies:   The following studies were reviewed today:  Carotid US 04/17/2018 Final Interpretation: Right Carotid: Velocities in the right ICA are consistent with a 1-39% stenosis. Left Carotid: Velocities in the left ICA are consistent with a 1-39% stenosis. Vertebrals: Bilateral vertebral arteries demonstrate antegrade flow.  Nuclear stress test 03/24/2018  Nuclear stress EF: 60%. The left ventricular ejection fraction is normal (55-65%).  The study is normal. There is no evidence of ischemia or previous infarction.  This is a low risk study.  There  was no ST segment deviation noted during stress.  Echo 03/24/2018 Mild LVH, EF 60-65, normal wall motion, diastolic dysfunction, aortic sclerosis without stenosis, mild AI, mild aortic root and ascending aortic dilatation (40 mm), mildly reduced RVSF, mild RAE, mild TR, PASP 40, mild to moderate PI  Past Medical History:  Diagnosis Date  . Arm pain 02/19/2012  . Bladder mass 04/16/2018  . CAD (coronary artery disease)    Hx CABG  . CAD (coronary artery disease) of bypass graft 02/19/2012  . Chest pain 06/11/2014  . DOE (dyspnea on exertion) 03/13/2018  . Gastric ulcer   . GERD (gastroesophageal reflux disease)   . High cholesterol   . HLD (hyperlipidemia) 02/19/2012  . HTN (hypertension) 02/19/2012  . Hypertension   . Left flank pain 04/16/2018  . Syncope 04/16/2018   Surgical Hx: The patient  has a past surgical history that includes Coronary artery bypass graft and gastric ulcer surgery.   Current Medications: Current Meds  Medication Sig  . aspirin EC 81 MG tablet Take 81 mg by mouth daily.  . capsaicin (ZOSTRIX) 0.025 % cream Apply topically 2 (two) times daily.  Marland Kitchen lisinopril-hydrochlorothiazide (PRINZIDE,ZESTORETIC) 20-12.5 MG per tablet Take 1 tablet by mouth daily.    . potassium chloride (K-DUR,KLOR-CON) 10 MEQ tablet Take 10 mEq by mouth daily.    . simvastatin (ZOCOR) 20 MG tablet Take 20 mg by mouth at bedtime.    . tamsulosin (FLOMAX) 0.4 MG CAPS capsule Take 0.4 mg by mouth at bedtime.  Allergies:   Penicillins   Social History   Tobacco Use  . Smoking status: Never Smoker  . Smokeless tobacco: Never Used  Substance Use Topics  . Alcohol use: No    Alcohol/week: 0.0 oz  . Drug use: No     Family Hx: The patient's family history includes Heart failure in his father.  ROS:   Please see the history of present illness.    ROS All other systems reviewed and are negative.   EKGs/Labs/Other Test Reviewed:    EKG:  EKG is  ordered today.  The ekg ordered  today demonstrates normal sinus rhythm, heart rate 63, left axis deviation, poor R wave progression, QTC 405, similar to prior tracing  Recent Labs: 04/16/2018: ALT 16; TSH 0.981 04/17/2018: BUN 14; Creatinine, Ser 1.05; Hemoglobin 11.4; Platelets 188; Potassium 4.0; Sodium 138   Recent Lipid Panel Lab Results  Component Value Date/Time   CHOL 134 02/20/2012 06:05 AM   TRIG 82 02/20/2012 06:05 AM   HDL 40 02/20/2012 06:05 AM   CHOLHDL 3.4 02/20/2012 06:05 AM   LDLCALC 78 02/20/2012 06:05 AM    Physical Exam:    VS:  BP (!) 130/54   Pulse 63   Ht 5\' 8"  (1.727 m)   Wt 176 lb (79.8 kg)   SpO2 95%   BMI 26.76 kg/m     Wt Readings from Last 3 Encounters:  05/16/18 176 lb (79.8 kg)  04/18/18 177 lb 4.8 oz (80.4 kg)  03/14/18 177 lb (80.3 kg)     Physical Exam  Constitutional: He is oriented to person, place, and time. He appears well-developed and well-nourished. No distress.  HENT:  Head: Normocephalic and atraumatic.  Neck: Neck supple. No JVD present.  Cardiovascular: Normal rate, regular rhythm, S1 normal and S2 normal.  No murmur heard. Pulmonary/Chest: Breath sounds normal. He has no rales.  Abdominal: Soft. There is no hepatomegaly.  Musculoskeletal: He exhibits no edema.  Neurological: He is alert and oriented to person, place, and time.  Skin: Skin is warm and dry.    ASSESSMENT & PLAN:    Syncope, unspecified syncope type  Unexplained syncope.  Recent echocardiogram demonstrated normal LV function.  He takes a combination ACE inhibitor and diuretic.  Notes from the hospital indicate that it was suspected that he was dehydrated.  He did have a period of lightheadedness and diaphoresis prior to passing out.  He is currently wearing an event monitor.  The available strips have been reviewed.  These demonstrate sinus rhythm no significant arrhythmias.  We discussed the importance of not driving for 6 months after syncopal episode.  We also discussed the importance of  hydration.  At this point, no medication changes will be made.  -Finish event monitor  -No driving for 6 months after last syncopal episode  Coronary artery disease involving native coronary artery of native heart without angina pectoris  History of CABG.  Recent nuclear stress test was low risk.  He denies anginal symptoms.  Continue aspirin, statin.  Essential hypertension  The patient's blood pressure is controlled on his current regimen.  Continue current therapy.    Dispo:  Return in about 3 months (around 08/25/2018) for Scheduled Follow Up with Dr. Katrinka Blazing.   Medication Adjustments/Labs and Tests Ordered: Current medicines are reviewed at length with the patient today.  Concerns regarding medicines are outlined above.  Tests Ordered: Orders Placed This Encounter  Procedures  . EKG 12-Lead   Medication Changes: No orders of the  defined types were placed in this encounter.   Signed, Tereso NewcomerScott Yailyn Strack, PA-C  05/16/2018 5:23 PM    [facility] [address], [place], [place]  [id number] Phone: [phone]; Fax: [phone]

## 2018-05-16 NOTE — Patient Instructions (Signed)
Medication Instructions:  1. Your physician recommends that you continue on your current medications as directed. Please refer to the Current Medication list given to you today.   Labwork: NONE ORDERED TODAY  Testing/Procedures: NONE ORDERED TODAY  Follow-Up: DR. Katrinka BlazingSMITH 08/25/18  Any Other Special Instructions Will Be Listed Below (If Applicable).  INCREASE YOUR FLUID INTAKE WHEN WORKING OUTSIDE  MAKE SURE TO TAKE FREQUENT BREAKS    If you need a refill on your cardiac medications before your next appointment, please call your pharmacy.

## 2018-05-24 NOTE — Progress Notes (Signed)
Scott, If monitor does not reveal significant arrhythmia, it is okay to resume driving.  Educated concerning premonitory symptoms of vasovagal syncope.  Teach safe behavior.  If any significant arrhythmias noted especially on the bradycardia side, would withhold driving privilege for 6 months.

## 2018-05-25 DIAGNOSIS — R350 Frequency of micturition: Secondary | ICD-10-CM | POA: Diagnosis not present

## 2018-05-25 DIAGNOSIS — N401 Enlarged prostate with lower urinary tract symptoms: Secondary | ICD-10-CM | POA: Diagnosis not present

## 2018-06-09 ENCOUNTER — Telehealth: Payer: Self-pay | Admitting: Physician Assistant

## 2018-06-09 NOTE — Telephone Encounter (Signed)
Please call patient. I reviewed his case with Dr. Katrinka BlazingSmith. Since his event monitor did not show any significant arrhythmias, he is ok to resume driving. Tereso NewcomerScott Charline Hoskinson, PA-C    06/09/2018 10:27 AM

## 2018-06-09 NOTE — Telephone Encounter (Signed)
Informed pt of information from Kindred HealthcareScott Weaver, [place]PA-C.  Pt verbalized understanding and was appreciative for call.

## 2018-06-09 NOTE — Telephone Encounter (Signed)
Left message to call back  

## 2018-07-12 ENCOUNTER — Ambulatory Visit: Payer: Medicare Other | Admitting: Interventional Cardiology

## 2018-08-18 DIAGNOSIS — Z0000 Encounter for general adult medical examination without abnormal findings: Secondary | ICD-10-CM | POA: Diagnosis not present

## 2018-08-18 DIAGNOSIS — E7800 Pure hypercholesterolemia, unspecified: Secondary | ICD-10-CM | POA: Diagnosis not present

## 2018-08-18 DIAGNOSIS — I10 Essential (primary) hypertension: Secondary | ICD-10-CM | POA: Diagnosis not present

## 2018-08-18 DIAGNOSIS — Z1389 Encounter for screening for other disorder: Secondary | ICD-10-CM | POA: Diagnosis not present

## 2018-08-18 DIAGNOSIS — E1169 Type 2 diabetes mellitus with other specified complication: Secondary | ICD-10-CM | POA: Diagnosis not present

## 2018-08-18 DIAGNOSIS — I2510 Atherosclerotic heart disease of native coronary artery without angina pectoris: Secondary | ICD-10-CM | POA: Diagnosis not present

## 2018-08-24 NOTE — Progress Notes (Signed)
Cardiology Office Note:    Date:  08/25/2018   ID:  Sean Kelley, DOB Sep 11, 1930, MRN [id number]  PCP:  Renford Dills, MD  Cardiologist:  Lesleigh Noe, MD   Referring MD: Renford Dills, MD   Chief Complaint  Patient presents with  . Coronary Artery Disease    History of Present Illness:    Sean Kelley is a 84 y.o. male with a hx of CAD with prior CABG, hypertension, hyperlipidemia, history of gastric ulcer disease, and recent syncope with negative workup.  He is doing well.  He denies angina.  He has not had orthopnea, PND, lower extremity swelling, or other cardiovascular complaints.  He had a hard sneeze earlier this year followed by a severe left flank and subcostal pain followed by fainting.  Work-up was negative.  Syncopal episode was likely related to vasovagal response to pain.   Past Medical History:  Diagnosis Date  . Arm pain 02/19/2012  . Bladder mass 04/16/2018  . CAD (coronary artery disease)    Hx CABG  . CAD (coronary artery disease) of bypass graft 02/19/2012  . Chest pain 06/11/2014  . DOE (dyspnea on exertion) 03/13/2018  . Gastric ulcer   . GERD (gastroesophageal reflux disease)   . High cholesterol   . HLD (hyperlipidemia) 02/19/2012  . HTN (hypertension) 02/19/2012  . Hypertension   . Left flank pain 04/16/2018  . Syncope 04/16/2018    Past Surgical History:  Procedure Laterality Date  . CORONARY ARTERY BYPASS GRAFT    . gastric ulcer surgery      Current Medications: Current Meds  Medication Sig  . aspirin EC 81 MG tablet Take 81 mg by mouth daily.  . capsaicin (ZOSTRIX) 0.025 % cream Apply topically 2 (two) times daily.  Marland Kitchen lisinopril-hydrochlorothiazide (PRINZIDE,ZESTORETIC) 20-12.5 MG per tablet Take 1 tablet by mouth daily.    . potassium chloride (K-DUR,KLOR-CON) 10 MEQ tablet Take 10 mEq by mouth daily.    . simvastatin (ZOCOR) 20 MG tablet Take 20 mg by mouth at bedtime.    . tamsulosin (FLOMAX) 0.4 MG CAPS capsule Take 0.4 mg by  mouth at bedtime.      Allergies:   Penicillins   Social History   Socioeconomic History  . Marital status: Married    Spouse name: Not on file  . Number of children: Not on file  . Years of education: Not on file  . Highest education level: Not on file  Occupational History  . Not on file  Social Needs  . Financial resource strain: Not on file  . Food insecurity:    Worry: Not on file    Inability: Not on file  . Transportation needs:    Medical: Not on file    Non-medical: Not on file  Tobacco Use  . Smoking status: Never Smoker  . Smokeless tobacco: Never Used  Substance and Sexual Activity  . Alcohol use: No    Alcohol/week: 0.0 standard drinks  . Drug use: No  . Sexual activity: Not on file  Lifestyle  . Physical activity:    Days per week: Not on file    Minutes per session: Not on file  . Stress: Not on file  Relationships  . Social connections:    Talks on phone: Not on file    Gets together: Not on file    Attends religious service: Not on file    Active member of club or organization: Not on file    Attends  meetings of clubs or organizations: Not on file    Relationship status: Not on file  Other Topics Concern  . Not on file  Social History Narrative  . Not on file     Family History: The patient's family history includes Heart failure in his father.  ROS:   Please see the history of present illness.    Muscle pain, vision disturbance, hearing loss.  All other systems reviewed and are negative.  EKGs/Labs/Other Studies Reviewed:    The following studies were reviewed today:  03/2018 Continuous Monitor:  Study Highlights      Normal sinus rhythm  Occational PAC's  No tachycardia or bradycardia   Overall normal study No explanation for syncope   Echocardiogram 02/2018: Study Conclusions   - Left ventricle: The cavity size was normal. Wall thickness was   increased in a pattern of mild LVH. Systolic function was normal.   The  estimated ejection fraction was in the range of 60% to 65%.   Wall motion was normal; there were no regional wall motion   abnormalities. Findings consistent with left ventricular   diastolic dysfunction, grade indeterminate. - Aortic valve: Trileaflet; mildly thickened, mildly calcified   leaflets. Sclerosis without stenosis. There was mild   regurgitation. - Aorta: Mild aortic root and ascending aortic dilatation. - Right ventricle: The cavity size was mildly dilated. Wall   thickness was normal. Systolic function was mildly reduced. - Right atrium: The atrium was mildly dilated. - Atrial septum: The septum bowed from right to left, consistent   with increased right atrial pressure. No defect or patent foramen   ovale was identified. - Tricuspid valve: There was mild regurgitation. - Pulmonic valve: There was mild to moderate regurgitation. - Pulmonary arteries: PA peak pressure: 40 mm Hg (S).  Stress Test 03/24/2018:  Nuclear stress EF: 60%. The left ventricular ejection fraction is normal (55-65%).  The study is normal. There is no evidence of ischemia or previous infarction.  This is a low risk study.  There was no ST segment deviation noted during stress.     EKG:  EKG is not ordered today.  The ekg June 2019 demonstrates left axis deviation, sinus rhythm, poor R wave progression.  No change compared to prior tracings.  Recent Labs: 04/16/2018: ALT 16; TSH 0.981 04/17/2018: BUN 14; Creatinine, Ser 1.05; Hemoglobin 11.4; Platelets 188; Potassium 4.0; Sodium 138  Recent Lipid Panel    Component Value Date/Time   CHOL 134 02/20/2012 0605   TRIG 82 02/20/2012 0605   HDL 40 02/20/2012 0605   CHOLHDL 3.4 02/20/2012 0605   VLDL 16 02/20/2012 0605   LDLCALC 78 02/20/2012 0605    Physical Exam:    VS:  BP 132/60   Pulse 67   Ht 5\' 4"  (1.626 m)   Wt 174 lb 3.2 oz (79 kg)   BMI 29.90 kg/m     Wt Readings from Last 3 Encounters:  08/25/18 174 lb 3.2 oz (79 kg)    05/16/18 176 lb (79.8 kg)  04/18/18 177 lb 4.8 oz (80.4 kg)     GEN: Appears younger than stated age.  Well nourished, well developed in no acute distress HEENT: Normal NECK: No JVD. LYMPHATICS: No lymphadenopathy CARDIAC: RRR, no murmur, no gallop, no edema. VASCULAR: 2+ bilateral radial pulses.  No bruits. RESPIRATORY:  Clear to auscultation without rales, wheezing or rhonchi  ABDOMEN: Soft, non-tender, non-distended, No pulsatile mass, MUSCULOSKELETAL: No deformity  SKIN: Warm and dry NEUROLOGIC:  Alert and  oriented x 3 PSYCHIATRIC:  Normal affect   ASSESSMENT:    1. Syncope, unspecified syncope type   2. Coronary artery disease involving coronary bypass graft of native heart without angina pectoris   3. Essential hypertension   4. Other hyperlipidemia    PLAN:    In order of problems listed above:  1. Suspect episode of syncope related to vasovagal response to subcostal pain induced by a strong sneeze.  Work-up is negative.  No further evaluation needed. 2. Stable without angina.  Encouraged an active lifestyle as he is already been doing.  LDL was less than 70 when evaluated earlier this year.  Blood pressure is at target. 3. Low-salt diet emphasized.  Blood pressure is at target of 130/80 mmHg or less. 4. Hyperlipidemia with target LDL less than 70.   Medication Adjustments/Labs and Tests Ordered: Current medicines are reviewed at length with the patient today.  Concerns regarding medicines are outlined above.  No orders of the defined types were placed in this encounter.  No orders of the defined types were placed in this encounter.   Patient Instructions  Medication Instructions:  Your physician recommends that you continue on your current medications as directed. Please refer to the Current Medication list given to you today.  Labwork: None   Testing/Procedures: None   Follow-Up: Your physician wants you to follow-up in 1 year with Dr. Katrinka Blazing. You will  receive a reminder letter in the mail two months in advance. If you don't receive a letter, please call our office to schedule the follow-up appointment.   Any Other Special Instructions Will Be Listed Below (If Applicable).     If you need a refill on your cardiac medications before your next appointment, please call your pharmacy.      Signed, Lesleigh Noe, MD  08/25/2018 9:48 AM    [facility]

## 2018-08-25 ENCOUNTER — Encounter (INDEPENDENT_AMBULATORY_CARE_PROVIDER_SITE_OTHER): Payer: Self-pay

## 2018-08-25 ENCOUNTER — Encounter: Payer: Self-pay | Admitting: Interventional Cardiology

## 2018-08-25 ENCOUNTER — Ambulatory Visit (INDEPENDENT_AMBULATORY_CARE_PROVIDER_SITE_OTHER): Payer: Medicare Other | Admitting: Interventional Cardiology

## 2018-08-25 VITALS — BP 132/60 | HR 67 | Ht 64.0 in | Wt 174.2 lb

## 2018-08-25 DIAGNOSIS — I25810 Atherosclerosis of coronary artery bypass graft(s) without angina pectoris: Secondary | ICD-10-CM

## 2018-08-25 DIAGNOSIS — R55 Syncope and collapse: Secondary | ICD-10-CM

## 2018-08-25 DIAGNOSIS — I10 Essential (primary) hypertension: Secondary | ICD-10-CM

## 2018-08-25 DIAGNOSIS — E7849 Other hyperlipidemia: Secondary | ICD-10-CM | POA: Diagnosis not present

## 2018-08-25 NOTE — Patient Instructions (Signed)

## 2018-11-30 IMAGING — MR MR HEAD W/O CM
9 of 10 series · 34 of 48 positions shown · non-contrast
Comparison: None.

CLINICAL DATA: Syncope. History of hypertension,
hypercholesterolemia.

EXAM:
MRI HEAD WITHOUT CONTRAST
TECHNIQUE: Multiplanar, multiecho pulse sequences of the brain and surrounding
structures were obtained without intravenous contrast.

[Series 3: DWI · axial · 3.0mm · 0.94mm/px · z∈[-67,+96]mm · 8 of 112 slices shown (1 of 2)]
[im 1/112]
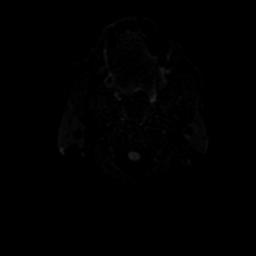
[im 13/112]
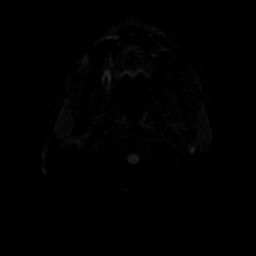
[im 38/112]
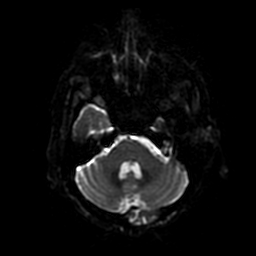
[im 50/112]
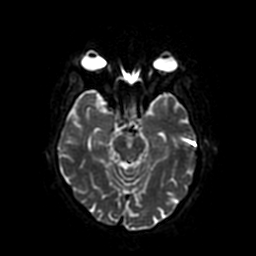
[im 62/112]
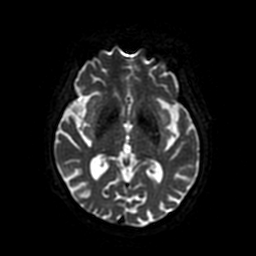
[im 75/112]
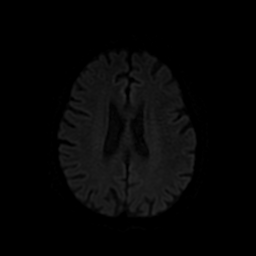
[im 99/112]
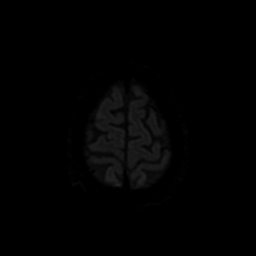
[im 112/112]
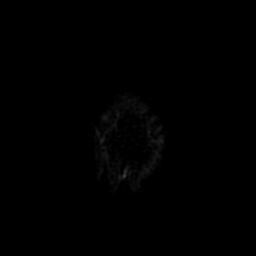

[Series 4: FLAIR · axial · 3.0mm · 0.47mm/px · z∈[-65,+95]mm · 2 of 28 slices shown (1 of 2)]
[im 1/28]
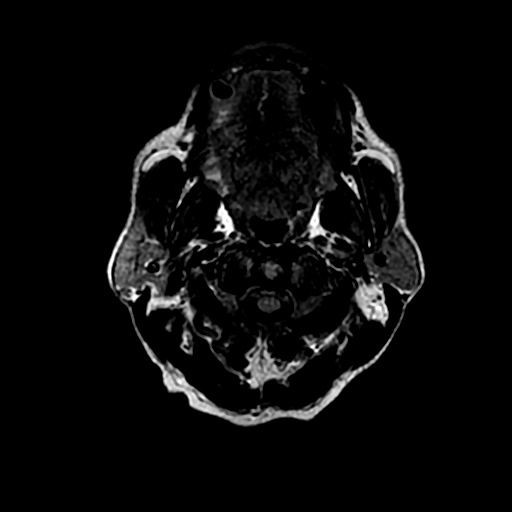
[im 28/28]
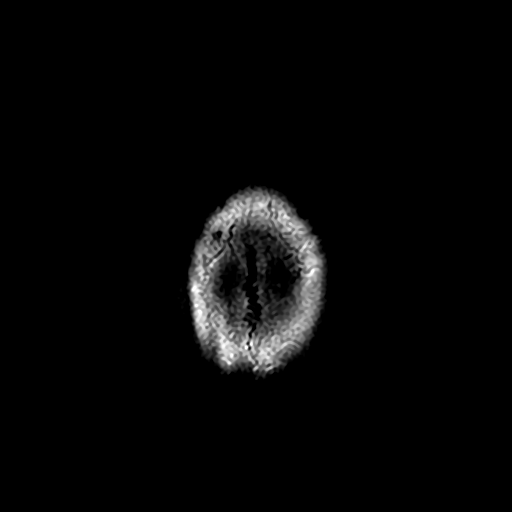

[Series 5: (person_name) · axial · 3.0mm · 0.47mm/px · z∈[-67,-12]mm · 3 of 112 slices shown]
[im 1/112]
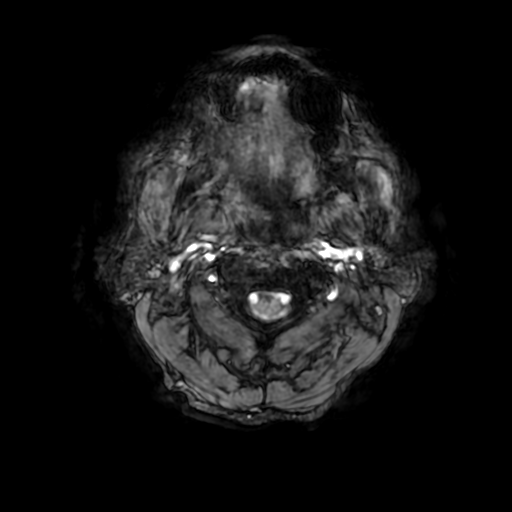
[im 13/112]
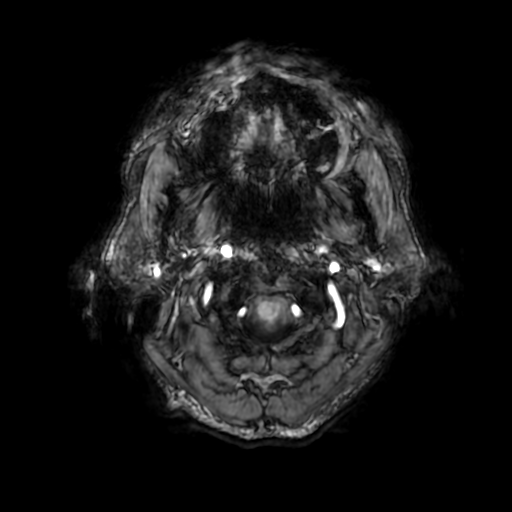
[im 38/112]
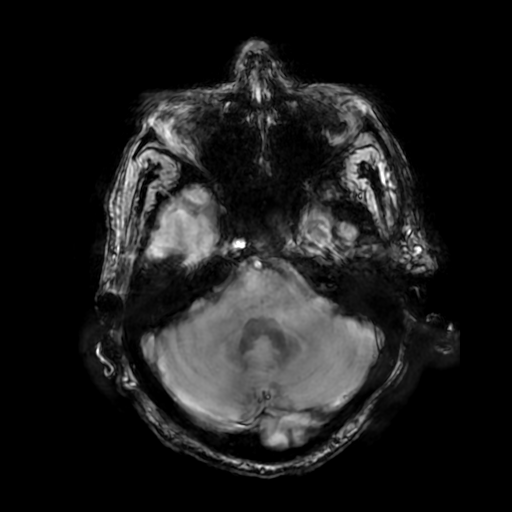

[Series 6: T2 · axial · 5.0mm · 0.47mm/px · z∈[-65,+95]mm · 2 of 28 slices shown (1 of 2)]
[im 1/28]
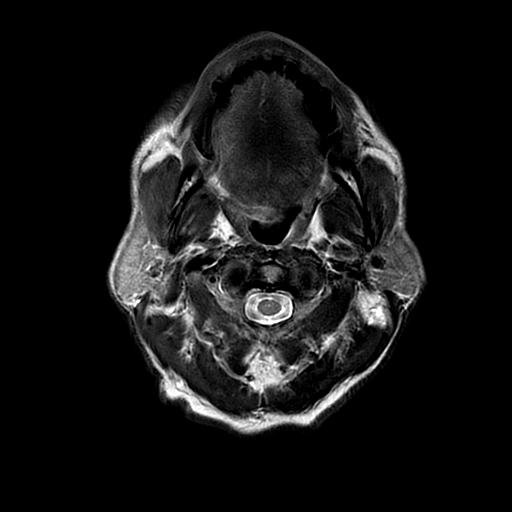
[im 28/28]
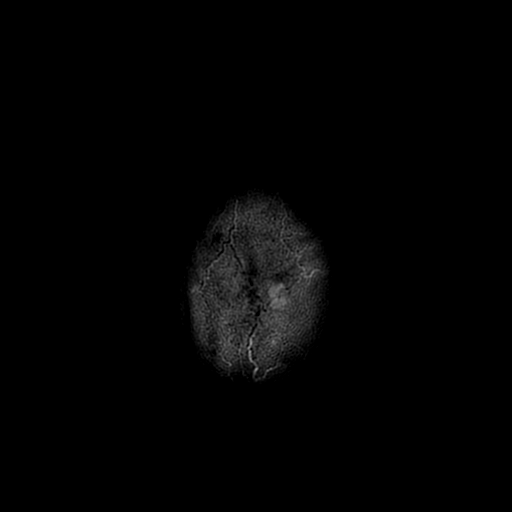

[Series 7: DWI · coronal · 4.0mm · 0.94mm/px · 6 of 74 slices shown (2 of 2)]
[im 1/74]
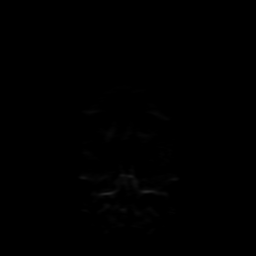
[im 15/74]
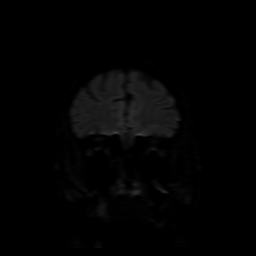
[im 30/74]
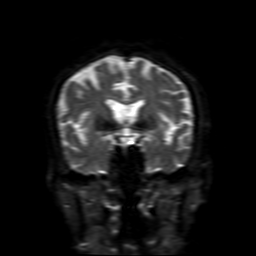
[im 44/74]
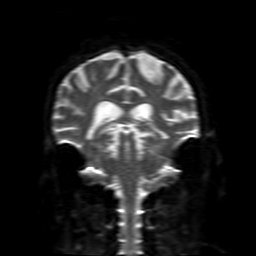
[im 59/74]
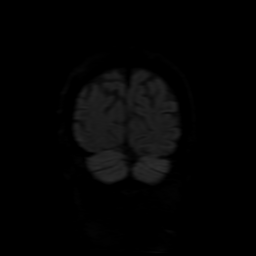
[im 74/74]
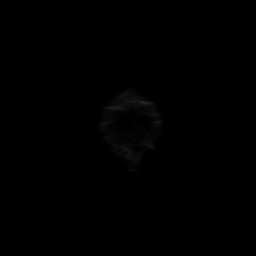

[Series 8: FLAIR · sagittal · 5.0mm · 0.47mm/px · 2 of 25 slices shown (2 of 2)]
[im 1/25]
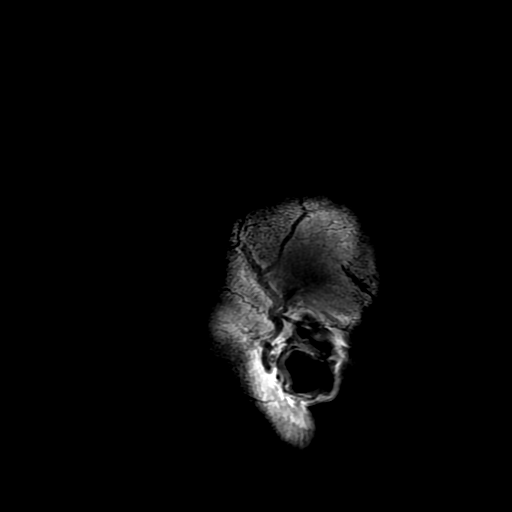
[im 25/25]
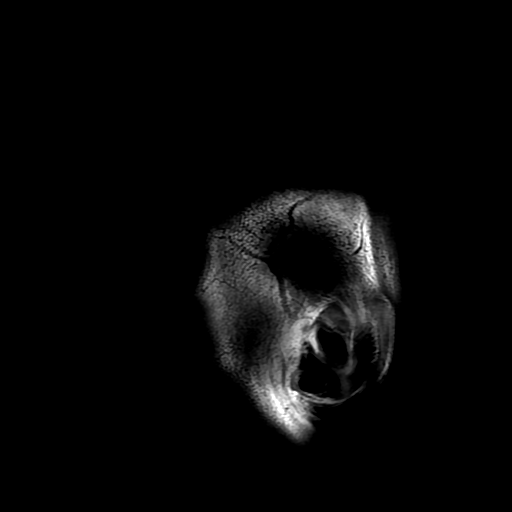

[Series 10: T2 · coronal · 5.0mm · 0.39mm/px · 3 of 31 slices shown (2 of 2)]
[im 1/31]
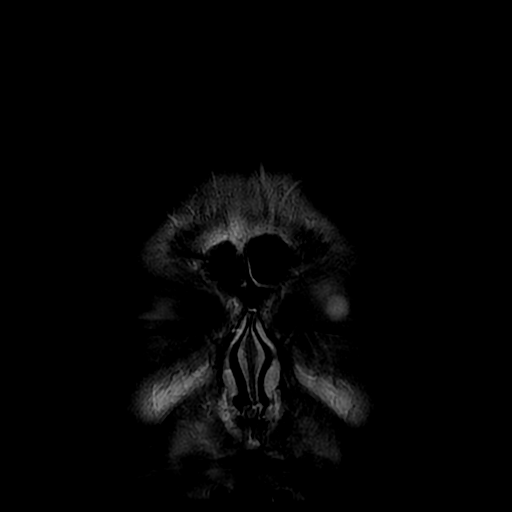
[im 16/31]
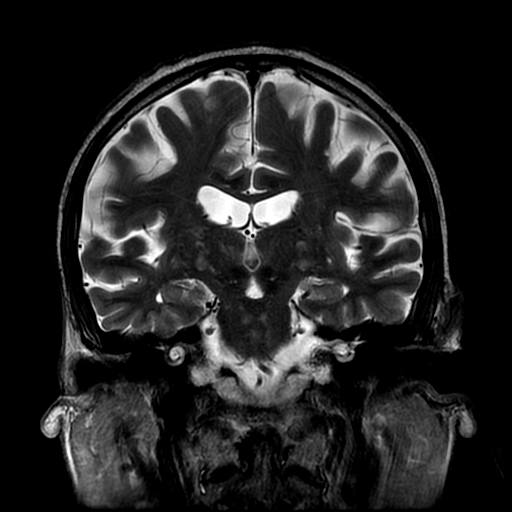
[im 31/31]
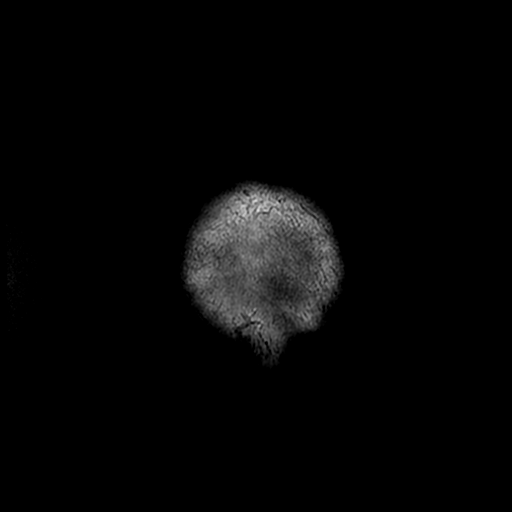

[Series 350: ADC · axial · 3.0mm · 0.94mm/px · z∈[-67,+96]mm · 5 of 56 slices shown (1 of 2)]
[im 1/56]
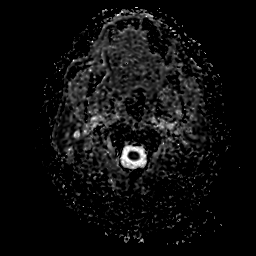
[im 14/56]
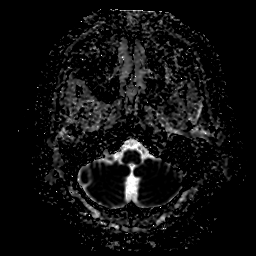
[im 28/56]
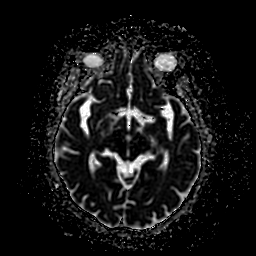
[im 42/56]
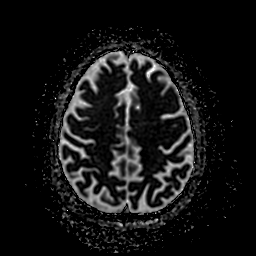
[im 56/56]
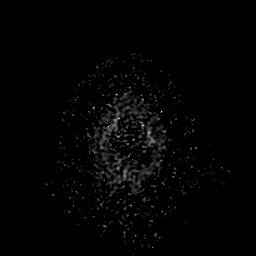

[Series 750: ADC · coronal · 4.0mm · 0.94mm/px · 3 of 37 slices shown (2 of 2)]
[im 1/37]
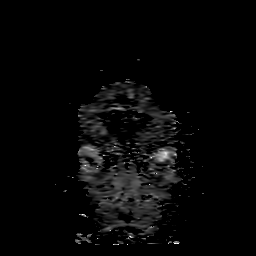
[im 19/37]
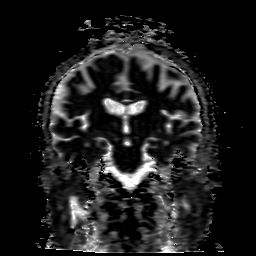
[im 37/37]
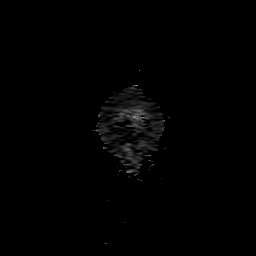

[34 of 48 positions shown; findings below may reference images not displayed]

FINDINGS: INTRACRANIAL CONTENTS: No reduced diffusion to suggest acute
ischemia. No susceptibility artifact to suggest hemorrhage. The
ventricles and sulci are normal for patient's age. Faint
supratentorial white matter FLAIR T2 hyperintensities compatible
with early chronic small vessel ischemic changes, less than expected
for age. No suspicious parenchymal signal, masses, mass effect. No
abnormal extra-axial fluid collections. No extra-axial masses.

VASCULAR: Normal major intracranial vascular flow voids present at
skull base.

SKULL AND UPPER CERVICAL SPINE: No abnormal sellar expansion. No
suspicious calvarial bone marrow signal. Craniocervical junction
maintained.

SINUSES/ORBITS: LEFT maxillary mucosal retention cyst. Trace
paranasal sinus mucosal thickening. Mastoid air cells are well
aerated.The included ocular globes and orbital contents are
non-suspicious.

OTHER: None.
IMPRESSION: Normal noncontrast MRI of the head for age.

## 2019-06-19 DIAGNOSIS — I2510 Atherosclerotic heart disease of native coronary artery without angina pectoris: Secondary | ICD-10-CM | POA: Diagnosis not present

## 2019-06-19 DIAGNOSIS — E1169 Type 2 diabetes mellitus with other specified complication: Secondary | ICD-10-CM | POA: Diagnosis not present

## 2019-06-19 DIAGNOSIS — I10 Essential (primary) hypertension: Secondary | ICD-10-CM | POA: Diagnosis not present

## 2019-06-19 DIAGNOSIS — E7800 Pure hypercholesterolemia, unspecified: Secondary | ICD-10-CM | POA: Diagnosis not present

## 2019-06-19 DIAGNOSIS — D649 Anemia, unspecified: Secondary | ICD-10-CM | POA: Diagnosis not present

## 2019-07-03 DIAGNOSIS — D649 Anemia, unspecified: Secondary | ICD-10-CM | POA: Diagnosis not present

## 2019-07-16 DIAGNOSIS — H906 Mixed conductive and sensorineural hearing loss, bilateral: Secondary | ICD-10-CM | POA: Diagnosis not present

## 2019-08-27 DIAGNOSIS — E1169 Type 2 diabetes mellitus with other specified complication: Secondary | ICD-10-CM | POA: Diagnosis not present

## 2019-08-27 DIAGNOSIS — Z0000 Encounter for general adult medical examination without abnormal findings: Secondary | ICD-10-CM | POA: Diagnosis not present

## 2019-08-27 DIAGNOSIS — E7800 Pure hypercholesterolemia, unspecified: Secondary | ICD-10-CM | POA: Diagnosis not present

## 2019-08-27 DIAGNOSIS — Z23 Encounter for immunization: Secondary | ICD-10-CM | POA: Diagnosis not present

## 2019-08-27 DIAGNOSIS — Z1389 Encounter for screening for other disorder: Secondary | ICD-10-CM | POA: Diagnosis not present

## 2019-08-27 DIAGNOSIS — I10 Essential (primary) hypertension: Secondary | ICD-10-CM | POA: Diagnosis not present

## 2019-08-27 DIAGNOSIS — I2510 Atherosclerotic heart disease of native coronary artery without angina pectoris: Secondary | ICD-10-CM | POA: Diagnosis not present

## 2019-08-30 ENCOUNTER — Telehealth: Payer: Self-pay | Admitting: Interventional Cardiology

## 2019-08-30 NOTE — Telephone Encounter (Signed)
  Patient wants wife to come with him to his appt on 10/6 with Dr Tamala Julian

## 2019-08-30 NOTE — Telephone Encounter (Signed)
Spoke with pt and marked ok for wife to come up with pt

## 2019-09-03 NOTE — Progress Notes (Signed)
Cardiology Office Note:    Date:  09/04/2019   ID:  Sean FeelingJohn E Kelley, DOB 10/16/1930, MRN [id number]  PCP:  Renford DillsPolite, Ronald, MD  Cardiologist:  Lesleigh NoeHenry W Berdell Hostetler III, MD   Referring MD: Renford DillsPolite, Ronald, MD   Chief Complaint  Patient presents with  . Coronary Artery Disease  . Hypertension    History of Present Illness:    Sean Kelley is a 85 y.o. male with a hx of  CAD with prior CABG x 02 June 2008, hypertension, hyperlipidemia, history of gastric ulcer disease, and syncope with negative workup.  Sean Kelley is doing quite well.  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  He denies cardiac complaints with physical activity.  Specifically he denies chest pain, dyspnea on exertion, decrement in exertional tolerance, lower extremity swelling, palpitations, and syncope.  Past Medical History:  Diagnosis Date  . Arm pain 02/19/2012  . Bladder mass 04/16/2018  . CAD (coronary artery disease)    Hx CABG  . CAD (coronary artery disease) of bypass graft 02/19/2012  . Chest pain 06/11/2014  . DOE (dyspnea on exertion) 03/13/2018  . Gastric ulcer   . GERD (gastroesophageal reflux disease)   . High cholesterol   . HLD (hyperlipidemia) 02/19/2012  . HTN (hypertension) 02/19/2012  . Hypertension   . Left flank pain 04/16/2018  . Syncope 04/16/2018    Past Surgical History:  Procedure Laterality Date  . CORONARY ARTERY BYPASS GRAFT    . gastric ulcer surgery      Current Medications: Current Meds  Medication Sig  . aspirin EC 81 MG tablet Take 81 mg by mouth daily.  Marland Kitchen. lisinopril-hydrochlorothiazide (PRINZIDE,ZESTORETIC) 20-12.5 MG per tablet Take 1 tablet by mouth daily.    . potassium chloride (K-DUR,KLOR-CON) 10 MEQ tablet Take 10 mEq by mouth daily.    . simvastatin (ZOCOR) 20 MG tablet Take 20 mg by mouth at bedtime.    . tamsulosin (FLOMAX) 0.4 MG CAPS capsule Take 0.4 mg by mouth at bedtime.       Allergies:   Penicillins   Social History   Socioeconomic History  . Marital status: Married    Spouse name: Not on file  . Number of children: Not on file  . Years of education: Not on file  . Highest education level: Not on file  Occupational History  . Not on file  Social Needs  . Financial resource strain: Not on file  . Food insecurity    Worry: Not on file    Inability: Not on file  . Transportation needs    Medical: Not on file    Non-medical: Not on file  Tobacco Use  . Smoking status: Never Smoker  . Smokeless tobacco: Never Used  Substance and Sexual Activity  . Alcohol use: No    Alcohol/week: 0.0 standard drinks  . Drug use: No  . Sexual activity: Not on file  Lifestyle  . Physical activity    Days per week: Not on file    Minutes per session: Not on file  . Stress: Not on file  Relationships  . Social Musicianconnections    Talks on phone: Not on file    Gets together: Not on file    Attends religious service: Not on file    Active member of club or organization: Not on file    Attends meetings  of clubs or organizations: Not on file    Relationship status: Not on file  Other Topics Concern  . Not on file  Social History Narrative  . Not on file     Family History: The patient's family history includes Heart failure in his father.  ROS:   Please see the history of present illness.    He does have some joint stiffness but he takes 2 teaspoons of honey every day and he feels this is responsible for his health.  Recent laboratory data revealed metabolic status is stable.  He inquired about the flu shot.  He really has no voiced complaints.  All other systems reviewed and are negative.  EKGs/Labs/Other Studies Reviewed:    The following studies were reviewed today: No new data.  EKG:  EKG sinus rhythm, poor R wave progression V1 through V5.  Precordial and lateral T wave inversion.  This tracing is unchanged from the prior most recent EKG performed in June  2019  Recent Labs: No results found for requested labs within last 8760 hours.  Recent Lipid Panel    Component Value Date/Time   CHOL 134 02/20/2012 0605   TRIG 82 02/20/2012 0605   HDL 40 02/20/2012 0605   CHOLHDL 3.4 02/20/2012 0605   VLDL 16 02/20/2012 0605   LDLCALC 78 02/20/2012 0605    Physical Exam:    VS:  BP 134/74   Pulse 60   Ht 5\' 4"  (1.626 m)   Wt 170 lb 6.4 oz (77.3 kg)   SpO2 95%   BMI 29.25 kg/m     Wt Readings from Last 3 Encounters:  09/04/19 170 lb 6.4 oz (77.3 kg)  08/25/18 174 lb 3.2 oz (79 kg)  05/16/18 176 lb (79.8 kg)     GEN: Appears younger than his stated age.. No acute distress HEENT: Normal NECK: No JVD. LYMPHATICS: No lymphadenopathy CARDIAC:  RRR without murmur, gallop, or edema. VASCULAR:  Normal Pulses. No bruits. RESPIRATORY:  Clear to auscultation without rales, wheezing or rhonchi  ABDOMEN: Soft, non-tender, non-distended, No pulsatile mass, MUSCULOSKELETAL: No deformity  SKIN: Warm and dry NEUROLOGIC:  Alert and oriented x 3 PSYCHIATRIC:  Normal affect   ASSESSMENT:    1. Coronary artery disease involving coronary bypass graft of native heart without angina pectoris   2. Essential hypertension   3. Other hyperlipidemia   4. Syncope, unspecified syncope type   5. Educated about COVID-19 virus infection    PLAN:    In order of problems listed above:  1. Secondary prevention discussed. 2. Excellent blood pressure control for age.  Target 140/80 mmHg or less. 3. LDL target of 75.  No change in statin therapy required. 4. No recurrent episodes of syncope. 5. The 3W's were discussed and endorsed by the patient.  Overall education and awareness concerning primary/secondary risk prevention was discussed in detail: LDL less than 70, hemoglobin A1c less than 7, blood pressure target less than 130/80 mmHg, >150 minutes of moderate aerobic activity per week, avoidance of smoking, weight control (via diet and exercise), and  continued surveillance/management of/for obstructive sleep apnea.  The flu shot is given today.  Medication Adjustments/Labs and Tests Ordered: Current medicines are reviewed at length with the patient today.  Concerns regarding medicines are outlined above.  Orders Placed This Encounter  Procedures  . EKG 12-Lead   No orders of the defined types were placed in this encounter.   Patient Instructions  Medication Instructions:  Your physician recommends that you  continue on your current medications as directed. Please refer to the Current Medication list given to you today.  If you need a refill on your cardiac medications before your next appointment, please call your pharmacy.   Lab work: None If you have labs (blood work) drawn today and your tests are completely normal, you will receive your results only by: Marland Kitchen MyChart Message (if you have MyChart) OR . A paper copy in the mail If you have any lab test that is abnormal or we need to change your treatment, we will call you to review the results.  Testing/Procedures: None  Follow-Up: At [facility], you and your health needs are our priority.  As part of our continuing mission to provide you with exceptional heart care, we have created designated Provider Care Teams.  These Care Teams include your primary Cardiologist (physician) and Advanced Practice Providers (APPs -  Physician Assistants and Nurse Practitioners) who all work together to provide you with the care you need, when you need it. You will need a follow up appointment in 12 months.  Please call our office 2 months in advance to schedule this appointment.  You may see Lesleigh Noe, MD or one of the following Advanced Practice Providers on your designated Care Team:   Norma Fredrickson, NP Nada Boozer, NP . Georgie Chard, NP  Any Other Special Instructions Will Be Listed Below (If Applicable).       Signed, Lesleigh Noe, MD  09/04/2019 2:21 PM    [facility]

## 2019-09-04 ENCOUNTER — Encounter: Payer: Self-pay | Admitting: Interventional Cardiology

## 2019-09-04 ENCOUNTER — Ambulatory Visit (INDEPENDENT_AMBULATORY_CARE_PROVIDER_SITE_OTHER): Payer: Medicare Other | Admitting: Interventional Cardiology

## 2019-09-04 ENCOUNTER — Other Ambulatory Visit: Payer: Self-pay

## 2019-09-04 VITALS — BP 134/74 | HR 60 | Ht 64.0 in | Wt 170.4 lb

## 2019-09-04 DIAGNOSIS — E7849 Other hyperlipidemia: Secondary | ICD-10-CM

## 2019-09-04 DIAGNOSIS — Z23 Encounter for immunization: Secondary | ICD-10-CM | POA: Diagnosis not present

## 2019-09-04 DIAGNOSIS — R55 Syncope and collapse: Secondary | ICD-10-CM

## 2019-09-04 DIAGNOSIS — I10 Essential (primary) hypertension: Secondary | ICD-10-CM

## 2019-09-04 DIAGNOSIS — Z7189 Other specified counseling: Secondary | ICD-10-CM

## 2019-09-04 DIAGNOSIS — I25810 Atherosclerosis of coronary artery bypass graft(s) without angina pectoris: Secondary | ICD-10-CM

## 2019-09-04 NOTE — Patient Instructions (Signed)

## 2020-01-23 DIAGNOSIS — Z23 Encounter for immunization: Secondary | ICD-10-CM | POA: Diagnosis not present

## 2020-01-24 DIAGNOSIS — H524 Presbyopia: Secondary | ICD-10-CM | POA: Diagnosis not present

## 2020-01-24 DIAGNOSIS — H35031 Hypertensive retinopathy, right eye: Secondary | ICD-10-CM | POA: Diagnosis not present

## 2020-02-19 DIAGNOSIS — E1169 Type 2 diabetes mellitus with other specified complication: Secondary | ICD-10-CM | POA: Diagnosis not present

## 2020-02-19 DIAGNOSIS — E7800 Pure hypercholesterolemia, unspecified: Secondary | ICD-10-CM | POA: Diagnosis not present

## 2020-02-19 DIAGNOSIS — I10 Essential (primary) hypertension: Secondary | ICD-10-CM | POA: Diagnosis not present

## 2020-02-19 DIAGNOSIS — I2510 Atherosclerotic heart disease of native coronary artery without angina pectoris: Secondary | ICD-10-CM | POA: Diagnosis not present

## 2020-02-20 DIAGNOSIS — Z23 Encounter for immunization: Secondary | ICD-10-CM | POA: Diagnosis not present

## 2020-03-31 DIAGNOSIS — H2513 Age-related nuclear cataract, bilateral: Secondary | ICD-10-CM | POA: Diagnosis not present

## 2020-03-31 DIAGNOSIS — H25013 Cortical age-related cataract, bilateral: Secondary | ICD-10-CM | POA: Diagnosis not present

## 2020-03-31 DIAGNOSIS — H52213 Irregular astigmatism, bilateral: Secondary | ICD-10-CM | POA: Diagnosis not present

## 2020-03-31 DIAGNOSIS — H468 Other optic neuritis: Secondary | ICD-10-CM | POA: Diagnosis not present

## 2020-03-31 DIAGNOSIS — H2511 Age-related nuclear cataract, right eye: Secondary | ICD-10-CM | POA: Diagnosis not present

## 2020-03-31 DIAGNOSIS — H40023 Open angle with borderline findings, high risk, bilateral: Secondary | ICD-10-CM | POA: Diagnosis not present

## 2020-04-15 DIAGNOSIS — H25811 Combined forms of age-related cataract, right eye: Secondary | ICD-10-CM | POA: Diagnosis not present

## 2020-04-15 DIAGNOSIS — H52211 Irregular astigmatism, right eye: Secondary | ICD-10-CM | POA: Diagnosis not present

## 2020-04-15 DIAGNOSIS — H2511 Age-related nuclear cataract, right eye: Secondary | ICD-10-CM | POA: Diagnosis not present

## 2020-04-24 DIAGNOSIS — H2511 Age-related nuclear cataract, right eye: Secondary | ICD-10-CM | POA: Diagnosis not present

## 2020-05-14 DIAGNOSIS — H2512 Age-related nuclear cataract, left eye: Secondary | ICD-10-CM | POA: Diagnosis not present

## 2020-05-14 DIAGNOSIS — H25012 Cortical age-related cataract, left eye: Secondary | ICD-10-CM | POA: Diagnosis not present

## 2020-06-17 DIAGNOSIS — H52212 Irregular astigmatism, left eye: Secondary | ICD-10-CM | POA: Diagnosis not present

## 2020-06-17 DIAGNOSIS — H25812 Combined forms of age-related cataract, left eye: Secondary | ICD-10-CM | POA: Diagnosis not present

## 2020-06-17 DIAGNOSIS — H2512 Age-related nuclear cataract, left eye: Secondary | ICD-10-CM | POA: Diagnosis not present

## 2020-06-17 DIAGNOSIS — H25012 Cortical age-related cataract, left eye: Secondary | ICD-10-CM | POA: Diagnosis not present

## 2020-06-17 DIAGNOSIS — H2511 Age-related nuclear cataract, right eye: Secondary | ICD-10-CM | POA: Diagnosis not present

## 2020-06-23 DIAGNOSIS — H2511 Age-related nuclear cataract, right eye: Secondary | ICD-10-CM | POA: Diagnosis not present

## 2020-07-17 DIAGNOSIS — S61210A Laceration without foreign body of right index finger without damage to nail, initial encounter: Secondary | ICD-10-CM | POA: Diagnosis not present

## 2020-07-17 DIAGNOSIS — W312XXA Contact with powered woodworking and forming machines, initial encounter: Secondary | ICD-10-CM | POA: Diagnosis not present

## 2020-07-17 DIAGNOSIS — S61310A Laceration without foreign body of right index finger with damage to nail, initial encounter: Secondary | ICD-10-CM | POA: Diagnosis not present

## 2020-07-17 DIAGNOSIS — Z23 Encounter for immunization: Secondary | ICD-10-CM | POA: Diagnosis not present

## 2020-07-17 DIAGNOSIS — Z951 Presence of aortocoronary bypass graft: Secondary | ICD-10-CM | POA: Diagnosis not present

## 2020-07-29 DIAGNOSIS — Z4802 Encounter for removal of sutures: Secondary | ICD-10-CM | POA: Diagnosis not present

## 2020-07-29 DIAGNOSIS — Z6828 Body mass index (BMI) 28.0-28.9, adult: Secondary | ICD-10-CM | POA: Diagnosis not present

## 2020-10-28 DIAGNOSIS — H5203 Hypermetropia, bilateral: Secondary | ICD-10-CM | POA: Diagnosis not present

## 2020-10-28 DIAGNOSIS — H40001 Preglaucoma, unspecified, right eye: Secondary | ICD-10-CM | POA: Diagnosis not present

## 2020-10-28 DIAGNOSIS — H524 Presbyopia: Secondary | ICD-10-CM | POA: Diagnosis not present

## 2020-10-28 DIAGNOSIS — H00021 Hordeolum internum right upper eyelid: Secondary | ICD-10-CM | POA: Diagnosis not present

## 2020-10-28 DIAGNOSIS — H02403 Unspecified ptosis of bilateral eyelids: Secondary | ICD-10-CM | POA: Diagnosis not present

## 2020-10-28 DIAGNOSIS — H16223 Keratoconjunctivitis sicca, not specified as Sjogren's, bilateral: Secondary | ICD-10-CM | POA: Diagnosis not present

## 2020-10-28 DIAGNOSIS — H35031 Hypertensive retinopathy, right eye: Secondary | ICD-10-CM | POA: Diagnosis not present

## 2020-10-28 DIAGNOSIS — H5700 Unspecified anomaly of pupillary function: Secondary | ICD-10-CM | POA: Diagnosis not present

## 2020-10-28 DIAGNOSIS — I10 Essential (primary) hypertension: Secondary | ICD-10-CM | POA: Diagnosis not present

## 2020-10-28 DIAGNOSIS — H0288A Meibomian gland dysfunction right eye, upper and lower eyelids: Secondary | ICD-10-CM | POA: Diagnosis not present

## 2020-10-28 DIAGNOSIS — H52223 Regular astigmatism, bilateral: Secondary | ICD-10-CM | POA: Diagnosis not present

## 2020-10-28 DIAGNOSIS — H35461 Secondary vitreoretinal degeneration, right eye: Secondary | ICD-10-CM | POA: Diagnosis not present

## 2020-11-11 DIAGNOSIS — H01001 Unspecified blepharitis right upper eyelid: Secondary | ICD-10-CM | POA: Diagnosis not present

## 2020-11-18 DIAGNOSIS — H40023 Open angle with borderline findings, high risk, bilateral: Secondary | ICD-10-CM | POA: Diagnosis not present

## 2020-11-26 DIAGNOSIS — Z23 Encounter for immunization: Secondary | ICD-10-CM | POA: Diagnosis not present

## 2020-12-26 NOTE — Progress Notes (Signed)
CARDIOLOGY OFFICE NOTE  Date:  12/29/2020    Sean Kelley Date of Birth: 02-05-1930 Medical Record #[id number]  PCP:  Sean Dills, MD  Cardiologist:  Sean Kelley  Chief Complaint  Patient presents with  . Follow-up    Seen for Dr. Katrinka Kelley    History of Present Illness: Sean Kelley is a 87 y.o. male who presents today for a follow up visit. Seen for Dr. Katrinka Kelley.   He has a known history of CAD with remote CABG x 5 in 2009, HTN, HLD, gastric ulcer disease and past syncope (2019) with a negative work up.   Last seen by Dr. Katrinka Kelley in October of 2020. 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 No cardiac issues.    Comes in today. Here alone. Doing well. 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 Not dizzy or lightheaded. No chest pain. He will have some infrequent shortness of breath if he really overdoes it - but really nothing that bothers him. No falls. Tolerating his medicines. Seeing PCP in March for his physical and labs. BP typically better at home.   Past Medical History:  Diagnosis Date  . Arm pain 02/19/2012  . Bladder mass 04/16/2018  . CAD (coronary artery disease)    Hx CABG  . CAD (coronary artery disease) of bypass graft 02/19/2012  . Chest pain 06/11/2014  . DOE (dyspnea on exertion) 03/13/2018  . Gastric ulcer   . GERD (gastroesophageal reflux disease)   . High cholesterol   . HLD (hyperlipidemia) 02/19/2012  . HTN (hypertension) 02/19/2012  . Hypertension   . Left flank pain 04/16/2018  . Syncope 04/16/2018    Past Surgical History:  Procedure Laterality Date  . CORONARY ARTERY BYPASS GRAFT    . gastric ulcer surgery       Medications: Current Meds  Medication Sig  . aspirin EC 81 MG tablet Take 81 mg by mouth daily.  Marland Kitchen lisinopril-hydrochlorothiazide (PRINZIDE,ZESTORETIC) 20-12.5 MG per tablet Take 1 tablet by mouth daily.  . potassium chloride (K-DUR,KLOR-CON) 10 MEQ tablet Take 10 mEq by  mouth daily.  . simvastatin (ZOCOR) 20 MG tablet Take 20 mg by mouth at bedtime.  . tamsulosin (FLOMAX) 0.4 MG CAPS capsule Take 0.4 mg by mouth at bedtime.      Allergies: Allergies  Allergen Reactions  . Penicillins Rash    Social History: The patient  reports that he has never smoked. He has never used smokeless tobacco. He reports that he does not drink alcohol and does not use drugs.   Family History: The patient's family history includes Heart failure in his father.   Review of Systems: Please see the history of present illness.   All other systems are reviewed and negative.   Physical Exam: VS:  BP (!) 160/68 (BP Location: Left Arm, Patient Position: Sitting, Cuff Size: Normal)   Pulse 69   Ht 5\' 6"  (1.676 m)   Wt 176 lb 1.9 oz (79.9 kg)   BMI 28.43 kg/m  .  BMI Body mass index is 28.43 kg/m.  Wt Readings from Last 3 Encounters:  12/29/20 176 lb 1.9 oz (79.9 kg)  09/04/19 170 lb 6.4 oz (77.3 kg)  08/25/18 174 lb 3.2 oz (79 kg)    General: Pleasant. Looks younger than his stated age. Alert and in no acute distress.   Cardiac: Regular rate and rhythm. No murmurs, rubs, or  gallops. No edema.  Respiratory:  Lungs are clear to auscultation bilaterally with normal work of breathing.  GI: Soft and nontender.  MS: No deformity or atrophy. Gait and ROM intact.  Skin: Warm and dry. Color is normal.  Neuro:  Strength and sensation are intact and no gross focal deficits noted.  Psych: Alert, appropriate and with normal affect.   LABORATORY DATA:  EKG:  EKG is ordered today.  Personally reviewed by me. This demonstrates NSR with RAD, prior anterior infarct - tracing unchanged. HR is 69.   Lab Results  Component Value Date   WBC 4.1 04/17/2018   HGB 11.4 (L) 04/17/2018   HCT 35.1 (L) 04/17/2018   PLT 188 04/17/2018   GLUCOSE 102 (H) 04/17/2018   CHOL 134 02/20/2012   TRIG 82 02/20/2012   HDL 40 02/20/2012   LDLCALC 78 02/20/2012   ALT 16 (L) 04/16/2018   AST 22  04/16/2018   NA 138 04/17/2018   K 4.0 04/17/2018   CL 105 04/17/2018   CREATININE 1.05 04/17/2018   BUN 14 04/17/2018   CO2 25 04/17/2018   TSH 0.981 04/16/2018   INR 1.8 (H) 06/17/2008   HGBA1C (H) 06/14/2008    6.5 (NOTE)   The ADA recommends the following therapeutic goal for glycemic   control related to Hgb A1C measurement:   Goal of Therapy:   < 7.0% Hgb A1C   Reference: American Diabetes Association: Clinical Practice   Recommendations 2008, Diabetes Care,  2008, 31:(Suppl 1).       BNP (last 3 results) No results for input(s): BNP in the last 8760 hours.  ProBNP (last 3 results) No results for input(s): PROBNP in the last 8760 hours.   Other Studies Reviewed Today:  Echo Study Conclusions 02/2018  - Left ventricle: The cavity size was normal. Wall thickness was    increased in a pattern of mild LVH. Systolic function was normal.    The estimated ejection fraction was in the range of 60% to 65%.    Wall motion was normal; there were no regional wall motion    abnormalities. Findings consistent with left ventricular    diastolic dysfunction, grade indeterminate.  - Aortic valve: Trileaflet; mildly thickened, mildly calcified    leaflets. Sclerosis without stenosis. There was mild    regurgitation.  - Aorta: Mild aortic root and ascending aortic dilatation.  - Right ventricle: The cavity size was mildly dilated. Wall    thickness was normal. Systolic function was mildly reduced.  - Right atrium: The atrium was mildly dilated.  - Atrial septum: The septum bowed from right to left, consistent    with increased right atrial pressure. No defect or patent foramen    ovale was identified.  - Tricuspid valve: There was mild regurgitation.  - Pulmonic valve: There was mild to moderate regurgitation.  - Pulmonary arteries: PA peak pressure: 40 mm Hg (S).     Myoview Study Highlights 02/2018  Addendum by Sean Mixer, MD on Fri Mar 24, 2018  1:34 PM  Nuclear  stress EF: 60%. The left ventricular ejection fraction is normal (55-65%).  The study is normal. There is no evidence of ischemia or previous infarction.  This is a low risk study.  There was no ST segment deviation noted during stress.   Finalized by Sean Mixer, MD on Fri Mar 24, 2018  1:24 PM  Nuclear stress EF: 60%. The left ventricular ejection fraction is normal (55-65%).  The study is  normal. There is no evidence of ischemia or previous infarction.  This is a low risk study.    Monitor Study Highlights 03/2018   Normal sinus rhythm  Occational PAC's  No tachycardia or bradycardia   Overall normal study No explanation for syncope    ASSESSMENT & PLAN:     1. CAD with remote CABG in 2009 - he continues to do very well - no worrisome symptoms noted.   2. HTN - better at home - he was in a different environment here at the [facility] office today - would continue to monitor.   3. HLD - on statin - labs in March by PCP  4. Mild bilateral carotid disease - 1 to 39% per study from 03/2018.   4. Advanced age - does very well overall.    Current medicines are reviewed with the patient today.  The patient does not have concerns regarding medicines other than what has been noted above.  The following changes have been made:  See above.  Labs/ tests ordered today include:    Orders Placed This Encounter  Procedures  . EKG 12-Lead     Disposition:   FU with Dr. Katrinka Kelley in June. Overall felt to be doing well from our standpoint.    Patient is agreeable to this plan and will call if any problems develop in the interim.   SignedNorma Fredrickson, NP  12/29/2020 11:32 AM  [facility] [address] [place], [place]  [address] Phone: [phone] Fax: [phone]

## 2020-12-29 ENCOUNTER — Encounter: Payer: Self-pay | Admitting: Nurse Practitioner

## 2020-12-29 ENCOUNTER — Other Ambulatory Visit: Payer: Self-pay

## 2020-12-29 ENCOUNTER — Ambulatory Visit (INDEPENDENT_AMBULATORY_CARE_PROVIDER_SITE_OTHER): Payer: Medicare Other | Admitting: Nurse Practitioner

## 2020-12-29 VITALS — BP 160/68 | HR 69 | Ht 66.0 in | Wt 176.1 lb

## 2020-12-29 DIAGNOSIS — I25810 Atherosclerosis of coronary artery bypass graft(s) without angina pectoris: Secondary | ICD-10-CM | POA: Diagnosis not present

## 2020-12-29 DIAGNOSIS — E7849 Other hyperlipidemia: Secondary | ICD-10-CM

## 2020-12-29 DIAGNOSIS — I10 Essential (primary) hypertension: Secondary | ICD-10-CM | POA: Diagnosis not present

## 2020-12-29 NOTE — Patient Instructions (Addendum)
After Visit Summary:  We will be checking the following labs today - NONE   Medication Instructions:    Continue with your current medicines.    If you need a refill on your cardiac medications before your next appointment, please call your pharmacy.     Testing/Procedures To Be Arranged:  N/A  Follow-Up:   See Dr. Katrinka Blazing in June    At [facility], you and your health needs are our priority.  As part of our continuing mission to provide you with exceptional heart care, we have created designated Provider Care Teams.  These Care Teams include your primary Cardiologist (physician) and Advanced Practice Providers (APPs -  Physician Assistants and Nurse Practitioners) who all work together to provide you with the care you need, when you need it.  Special Instructions:  . Stay safe, wash your hands for at least 20 seconds and wear a mask when needed.  . It was good to talk with you today.    Call the [facility] office at [phone] if you have any questions, problems or concerns.

## 2021-03-10 DIAGNOSIS — I10 Essential (primary) hypertension: Secondary | ICD-10-CM | POA: Diagnosis not present

## 2021-03-10 DIAGNOSIS — D649 Anemia, unspecified: Secondary | ICD-10-CM | POA: Diagnosis not present

## 2021-03-10 DIAGNOSIS — Z0000 Encounter for general adult medical examination without abnormal findings: Secondary | ICD-10-CM | POA: Diagnosis not present

## 2021-03-10 DIAGNOSIS — E7800 Pure hypercholesterolemia, unspecified: Secondary | ICD-10-CM | POA: Diagnosis not present

## 2021-03-10 DIAGNOSIS — I2510 Atherosclerotic heart disease of native coronary artery without angina pectoris: Secondary | ICD-10-CM | POA: Diagnosis not present

## 2021-03-10 DIAGNOSIS — E1169 Type 2 diabetes mellitus with other specified complication: Secondary | ICD-10-CM | POA: Diagnosis not present

## 2021-03-10 DIAGNOSIS — R251 Tremor, unspecified: Secondary | ICD-10-CM | POA: Diagnosis not present

## 2021-03-10 DIAGNOSIS — Z1389 Encounter for screening for other disorder: Secondary | ICD-10-CM | POA: Diagnosis not present

## 2021-03-19 DIAGNOSIS — I10 Essential (primary) hypertension: Secondary | ICD-10-CM | POA: Diagnosis not present

## 2021-03-19 DIAGNOSIS — E1169 Type 2 diabetes mellitus with other specified complication: Secondary | ICD-10-CM | POA: Diagnosis not present

## 2021-03-19 DIAGNOSIS — E7800 Pure hypercholesterolemia, unspecified: Secondary | ICD-10-CM | POA: Diagnosis not present

## 2021-03-24 DIAGNOSIS — H35033 Hypertensive retinopathy, bilateral: Secondary | ICD-10-CM | POA: Diagnosis not present

## 2021-05-06 NOTE — Progress Notes (Signed)
Cardiology Office Note:    Date:  05/08/2021   ID:  Sean Kelley, DOB Sep 07, 1930, MRN [id number]  PCP:  Renford Dills, MD  Cardiologist:  Lesleigh Noe, MD   Referring MD: Renford Dills, MD   Chief Complaint  Patient presents with   Coronary Artery Disease   Hypertension     History of Present Illness:    Sean Kelley is a 87 y.o. male with a hx of CAD with prior CABG x 02 June 2008, hypertension, hyperlipidemia, history of gastric ulcer disease, and syncope with negative workup.  He is not short of breath.  He is not having angina.  Did heavy work in his yard yesterday without difficulty.  Wants to know if he should be on potassium supplementation.  He asks because his primary physician would not refill it.  Last potassium was 5.4.  He has not been on potassium for at least a month.  He is not monitoring blood pressures at home.  Says they tend to run higher in the office than at home.  Past Medical History:  Diagnosis Date   Arm pain 02/19/2012   Bladder mass 04/16/2018   CAD (coronary artery disease)    Hx CABG   CAD (coronary artery disease) of bypass graft 02/19/2012   Chest pain 06/11/2014   DOE (dyspnea on exertion) 03/13/2018   Gastric ulcer    GERD (gastroesophageal reflux disease)    High cholesterol    HLD (hyperlipidemia) 02/19/2012   HTN (hypertension) 02/19/2012   Hypertension    Left flank pain 04/16/2018   Syncope 04/16/2018    Past Surgical History:  Procedure Laterality Date   CORONARY ARTERY BYPASS GRAFT     gastric ulcer surgery      Current Medications: Current Meds  Medication Sig   aspirin EC 81 MG tablet Take 81 mg by mouth daily.   lisinopril-hydrochlorothiazide (PRINZIDE,ZESTORETIC) 20-12.5 MG per tablet Take 1 tablet by mouth daily.   potassium chloride (K-DUR,KLOR-CON) 10 MEQ tablet Take 10 mEq by mouth daily.   simvastatin (ZOCOR) 20 MG tablet Take 20 mg by mouth at bedtime.   tamsulosin (FLOMAX) 0.4 MG CAPS capsule Take 0.4 mg by  mouth at bedtime.      Allergies:   Penicillins   Social History   Socioeconomic History   Marital status: Married    Spouse name: Not on file   Number of children: Not on file   Years of education: Not on file   Highest education level: Not on file  Occupational History   Not on file  Tobacco Use   Smoking status: Never   Smokeless tobacco: Never  Vaping Use   Vaping Use: Never used  Substance and Sexual Activity   Alcohol use: No    Alcohol/week: 0.0 standard drinks   Drug use: No   Sexual activity: Not on file  Other Topics Concern   Not on file  Social History Narrative   Not on file   Social Determinants of Health   Financial Resource Strain: Not on file  Food Insecurity: Not on file  Transportation Needs: Not on file  Physical Activity: Not on file  Stress: Not on file  Social Connections: Not on file     Family History: The patient's family history includes Heart failure in his father.  ROS:   Please see the history of present illness.    No complaints.  All other systems reviewed and are negative.  EKGs/Labs/Other Studies Reviewed:  The following studies were reviewed today: No new data  EKG:  EKG not performed on today's visit.  The last tracing performed was January 2022 revealed left axis deviation, poor R wave progression, and no acute change.  Recent Labs: No results found for requested labs within last 8760 hours.  Recent Lipid Panel    Component Value Date/Time   CHOL 134 02/20/2012 0605   TRIG 82 02/20/2012 0605   HDL 40 02/20/2012 0605   CHOLHDL 3.4 02/20/2012 0605   VLDL 16 02/20/2012 0605   LDLCALC 78 02/20/2012 0605    Physical Exam:    VS:  BP (!) 158/70   Pulse 70   Ht 5\' 6"  (1.676 m)   Wt 174 lb (78.9 kg)   SpO2 99%   BMI 28.08 kg/m     Wt Readings from Last 3 Encounters:  05/08/21 174 lb (78.9 kg)  12/29/20 176 lb 1.9 oz (79.9 kg)  09/04/19 170 lb 6.4 oz (77.3 kg)     GEN: Overweight. No acute  distress HEENT: Normal NECK: No JVD. LYMPHATICS: No lymphadenopathy CARDIAC: No murmur. RRR no gallop, or edema. VASCULAR:  Normal Pulses. No bruits. RESPIRATORY:  Clear to auscultation without rales, wheezing or rhonchi  ABDOMEN: Soft, non-tender, non-distended, No pulsatile mass, MUSCULOSKELETAL: No deformity  SKIN: Warm and dry NEUROLOGIC:  Alert and oriented x 3 PSYCHIATRIC:  Normal affect   ASSESSMENT:    1. Coronary artery disease involving coronary bypass graft of native heart without angina pectoris   2. Essential hypertension   3. Other hyperlipidemia   4. Syncope, unspecified syncope type    PLAN:    In order of problems listed above:  Risk prevention discussed. Monitor blood pressure at home and if running consistently greater than 140 mmHg, will add low-dose amlodipine for better systolic control. Continue Zocor 20 mg/day.  Most recent LDL was 82. No recurrence Potassium was 5.4 when last checked by Dr. 11/04/19.  EKG was not refilled.  Check potassium level today to ensure accuracy of decision.  Overall education and awareness concerning secondary risk prevention was discussed in detail: LDL less than 70, hemoglobin A1c less than 7, blood pressure target less than 130/80 mmHg, >150 minutes of moderate aerobic activity per week, avoidance of smoking, weight control (via diet and exercise), and continued surveillance/management of/for obstructive sleep apnea.  Follow-up in 1 year with new provider.  Notified of my plan to retire within the next 6 months.  Medication Adjustments/Labs and Tests Ordered: Current medicines are reviewed at length with the patient today.  Concerns regarding medicines are outlined above.  Orders Placed This Encounter  Procedures   Basic metabolic panel   No orders of the defined types were placed in this encounter.   Patient Instructions  Medication Instructions:  Your physician recommends that you continue on your current medications  as directed. Please refer to the Current Medication list given to you today.  *If you need a refill on your cardiac medications before your next appointment, please call your pharmacy*   Lab Work: BMET today  If you have labs (blood work) drawn today and your tests are completely normal, you will receive your results only by: MyChart Message (if you have MyChart) OR A paper copy in the mail If you have any lab test that is abnormal or we need to change your treatment, we will call you to review the results.   Testing/Procedures: None   Follow-Up: At [facility], you and your health needs  are our priority.  As part of our continuing mission to provide you with exceptional heart care, we have created designated Provider Care Teams.  These Care Teams include your primary Cardiologist (physician) and Advanced Practice Providers (APPs -  Physician Assistants and Nurse Practitioners) who all work together to provide you with the care you need, when you need it.  We recommend signing up for the patient portal called "MyChart".  Sign up information is provided on this After Visit Summary.  MyChart is used to connect with patients for Virtual Visits (Telemedicine).  Patients are able to view lab/test results, encounter notes, upcoming appointments, etc.  Non-urgent messages can be sent to your provider as well.   To learn more about what you can do with MyChart, go to [email or web address].    Your next appointment:   1 year(s)  The format for your next appointment:   In Person  Provider:   You may see Riley Lam, MD or one of the following Advanced Practice Providers on your designated Care Team:   Ronie Spies, PA-C Jacolyn Reedy, PA-C   Other Instructions     Signed, Lesleigh Noe, MD  05/08/2021 11:33 AM    [facility]

## 2021-05-08 ENCOUNTER — Encounter: Payer: Self-pay | Admitting: Interventional Cardiology

## 2021-05-08 ENCOUNTER — Other Ambulatory Visit: Payer: Self-pay

## 2021-05-08 ENCOUNTER — Ambulatory Visit (INDEPENDENT_AMBULATORY_CARE_PROVIDER_SITE_OTHER): Payer: Medicare Other | Admitting: Interventional Cardiology

## 2021-05-08 VITALS — BP 158/70 | HR 70 | Ht 66.0 in | Wt 174.0 lb

## 2021-05-08 DIAGNOSIS — E7849 Other hyperlipidemia: Secondary | ICD-10-CM | POA: Diagnosis not present

## 2021-05-08 DIAGNOSIS — I25810 Atherosclerosis of coronary artery bypass graft(s) without angina pectoris: Secondary | ICD-10-CM

## 2021-05-08 DIAGNOSIS — R55 Syncope and collapse: Secondary | ICD-10-CM | POA: Diagnosis not present

## 2021-05-08 DIAGNOSIS — I10 Essential (primary) hypertension: Secondary | ICD-10-CM

## 2021-05-08 NOTE — Patient Instructions (Signed)
Medication Instructions:  Your physician recommends that you continue on your current medications as directed. Please refer to the Current Medication list given to you today.  *If you need a refill on your cardiac medications before your next appointment, please call your pharmacy*   Lab Work: BMET today  If you have labs (blood work) drawn today and your tests are completely normal, you will receive your results only by: MyChart Message (if you have MyChart) OR A paper copy in the mail If you have any lab test that is abnormal or we need to change your treatment, we will call you to review the results.   Testing/Procedures: None   Follow-Up: At [facility], you and your health needs are our priority.  As part of our continuing mission to provide you with exceptional heart care, we have created designated Provider Care Teams.  These Care Teams include your primary Cardiologist (physician) and Advanced Practice Providers (APPs -  Physician Assistants and Nurse Practitioners) who all work together to provide you with the care you need, when you need it.  We recommend signing up for the patient portal called "MyChart".  Sign up information is provided on this After Visit Summary.  MyChart is used to connect with patients for Virtual Visits (Telemedicine).  Patients are able to view lab/test results, encounter notes, upcoming appointments, etc.  Non-urgent messages can be sent to your provider as well.   To learn more about what you can do with MyChart, go to [email or web address].    Your next appointment:   1 year(s)  The format for your next appointment:   In Person  Provider:   You may see Riley Lam, MD or one of the following Advanced Practice Providers on your designated Care Team:   Ronie Spies, PA-C Jacolyn Reedy, PA-C   Other Instructions

## 2021-05-09 LAB — BASIC METABOLIC PANEL
BUN/Creatinine Ratio: 21 (ref 10–24)
BUN: 31 mg/dL — ABNORMAL HIGH (ref 8–27)
CO2: 23 mmol/L (ref 20–29)
Calcium: 9.4 mg/dL (ref 8.6–10.2)
Chloride: 103 mmol/L (ref 96–106)
Creatinine, Ser: 1.48 mg/dL — ABNORMAL HIGH (ref 0.76–1.27)
Glucose: 101 mg/dL — ABNORMAL HIGH (ref 65–99)
Potassium: 5.5 mmol/L — ABNORMAL HIGH (ref 3.5–5.2)
Sodium: 138 mmol/L (ref 134–144)
eGFR: 45 mL/min/{1.73_m2} — ABNORMAL LOW (ref >59)

## 2021-05-11 ENCOUNTER — Telehealth: Payer: Self-pay | Admitting: *Deleted

## 2021-05-11 DIAGNOSIS — E875 Hyperkalemia: Secondary | ICD-10-CM

## 2021-05-11 NOTE — Telephone Encounter (Signed)
Spoke with pt and reviewed results and recommendations.  Pt agreeable to plan.  He will come by the office on 6/21 for labs.

## 2021-05-11 NOTE — Telephone Encounter (Signed)
-----   Message from Lyn Records, MD sent at 05/09/2021 11:54 AM EDT ----- Let the patient know the potassium is high. He should not take potassium supplements and needs to be on low potassium diet. Repeat BMET 1 week and if potassium still high, refer for nephrology consult. A copy will be sent to Renford Dills, MD

## 2021-05-19 ENCOUNTER — Other Ambulatory Visit: Payer: Medicare Other | Admitting: *Deleted

## 2021-05-19 ENCOUNTER — Other Ambulatory Visit: Payer: Self-pay

## 2021-05-19 DIAGNOSIS — E875 Hyperkalemia: Secondary | ICD-10-CM

## 2021-05-19 LAB — BASIC METABOLIC PANEL
BUN/Creatinine Ratio: 23 (ref 10–24)
BUN: 33 mg/dL — ABNORMAL HIGH (ref 8–27)
CO2: 22 mmol/L (ref 20–29)
Calcium: 9.1 mg/dL (ref 8.6–10.2)
Chloride: 104 mmol/L (ref 96–106)
Creatinine, Ser: 1.45 mg/dL — ABNORMAL HIGH (ref 0.76–1.27)
Glucose: 93 mg/dL (ref 65–99)
Potassium: 5.4 mmol/L — ABNORMAL HIGH (ref 3.5–5.2)
Sodium: 140 mmol/L (ref 134–144)
eGFR: 46 mL/min/{1.73_m2} — ABNORMAL LOW (ref >59)

## 2021-05-20 ENCOUNTER — Telehealth: Payer: Self-pay | Admitting: *Deleted

## 2021-05-20 DIAGNOSIS — E875 Hyperkalemia: Secondary | ICD-10-CM

## 2021-05-20 DIAGNOSIS — I10 Essential (primary) hypertension: Secondary | ICD-10-CM

## 2021-05-20 MED ORDER — HYDROCHLOROTHIAZIDE 12.5 MG PO CAPS: 12.5000 mg | ORAL_CAPSULE | Freq: Every day | ORAL | 11 refills | Status: DC

## 2021-05-20 MED ORDER — AMLODIPINE BESYLATE 2.5 MG PO TABS: 2.5000 mg | ORAL_TABLET | Freq: Every day | ORAL | 11 refills | Status: DC

## 2021-05-20 NOTE — Telephone Encounter (Signed)
Patient notified.  Prescriptions sent to [facility] in [place].  Patient reports the following BP/pulse readings 125/58, 59 126/62,58 117/47,62 131/59,52.  He will continue to monitor BP/pulse and bring readings and BP cuff to hypertension clinic appointment.  Appointment made for June 05, 2021 at 2:30.   Patient reports he eats a lot of bananas.  He will eliminate this from his diet.

## 2021-05-20 NOTE — Telephone Encounter (Signed)
-----   Message from Lyn Records, MD sent at 05/20/2021 12:29 PM EDT ----- Let the patient know the labs continue to show increased potassium. Stop Prinzide. Start Hctz 12.5 mg daily and add amlodipine 2.5 mg daily. Target BP < 140/80. Needs BP clinic f/u and BMET in 2 weeks. A copy will be sent to Renford Dills, MD

## 2021-06-05 ENCOUNTER — Ambulatory Visit (INDEPENDENT_AMBULATORY_CARE_PROVIDER_SITE_OTHER): Payer: Medicare Other | Admitting: Pharmacist

## 2021-06-05 ENCOUNTER — Other Ambulatory Visit: Payer: Medicare Other | Admitting: *Deleted

## 2021-06-05 ENCOUNTER — Other Ambulatory Visit: Payer: Self-pay

## 2021-06-05 VITALS — BP 130/50 | HR 60

## 2021-06-05 DIAGNOSIS — I10 Essential (primary) hypertension: Secondary | ICD-10-CM | POA: Diagnosis not present

## 2021-06-05 DIAGNOSIS — E875 Hyperkalemia: Secondary | ICD-10-CM | POA: Diagnosis not present

## 2021-06-05 NOTE — Progress Notes (Signed)
[name]                 DOB: 03/11/1930                      MRN: [id number]     HPI: Sean Kelley is a 87 y.o. male referred by Dr. Katrinka Blazing to HTN clinic. PMH is significant for CAD with prior CABG x 02 June 2008, hypertension, hyperlipidemia, history of gastric ulcer disease, and syncope with negative workup. Patient recently found to have K of 5.4. KCL supplements stopped, but KCL still high. Therefore lisinopril was stopped. HCTZ 12.5mg  daily and amlodipine 2.5mg  daily started on 05/20/21.  Patient presents today for follow up. He denes dizziness, lightheadedness, headache, blurred vision, SOB or swelling. States he feels great. 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 He has been cutting back on his banana intake. He brings in his home OMRON upper arm cuff. Home cuff 133/60 clinic maual reading 130/50.   Current HTN meds: HCTZ 12.5mg  daily, amlodipine 2.5mg  daily Previously tried: lisinopril (hyperkalemia) BP goal: <140/90  Family History:  Family History  Problem Relation Age of Onset   Heart failure Father     Social History: no tobacco, no ETOH  Diet: not discussed  Exercise: gardening, wood work-work shop  Home BP readings: 117/45, 123/47, 125/58, 141/63, 117/47, 133/58, 131/59, 136/62, 144/57, 131/54,128/58, 152/61 HR 55-70  Wt Readings from Last 3 Encounters:  05/08/21 174 lb (78.9 kg)  12/29/20 176 lb 1.9 oz (79.9 kg)  09/04/19 170 lb 6.4 oz (77.3 kg)   BP Readings from Last 3 Encounters:  05/08/21 (!) 158/70  12/29/20 (!) 160/68  09/04/19 134/74   Pulse Readings from Last 3 Encounters:  05/08/21 70  12/29/20 69  09/04/19 60    Renal function: CrCl cannot be calculated (Unknown ideal weight.).  Past Medical History:  Diagnosis Date   Arm pain 02/19/2012   Bladder mass 04/16/2018   CAD (coronary artery disease)    Hx CABG   CAD (coronary artery disease) of bypass graft 02/19/2012   Chest pain 06/11/2014   DOE (dyspnea  on exertion) 03/13/2018   Gastric ulcer    GERD (gastroesophageal reflux disease)    High cholesterol    HLD (hyperlipidemia) 02/19/2012   HTN (hypertension) 02/19/2012   Hypertension    Left flank pain 04/16/2018   Syncope 04/16/2018    Current Outpatient Medications on File Prior to Visit  Medication Sig Dispense Refill   amLODipine (NORVASC) 2.5 MG tablet Take 1 tablet (2.5 mg total) by mouth daily. 30 tablet 11   aspirin EC 81 MG tablet Take 81 mg by mouth daily.     hydrochlorothiazide (MICROZIDE) 12.5 MG capsule Take 1 capsule (12.5 mg total) by mouth daily. 30 capsule 11   simvastatin (ZOCOR) 20 MG tablet Take 20 mg by mouth at bedtime.     tamsulosin (FLOMAX) 0.4 MG CAPS capsule Take 0.4 mg by mouth at bedtime.      No current facility-administered medications on file prior to visit.    Allergies  Allergen Reactions   Penicillins Rash     Assessment/Plan:  1. Hypertension - Blood pressure is at goal of <140/90 in clinic today. Home cuff found to be accurate. Diastolic a little higher on home cuff. His blood pressures at home vary. Most at goal, a few over, but he is not resting much before checking and taking BP before medications  in the AM. He actually had a few lower readings, especially the diastolic. Will back down on his HCTZ to 12.5mg  MWF. If his K comes back borderline high, we may need to reconsider this and stop the amlodipine instead. Will follow up on labs when they result. Patient to follow up with me prn.  Thank you,  Olene Floss, Pharm.D, BCPS, CPP [facility]  [address], [place], [place] [address]  Phone: [phone]; Fax: [phone]

## 2021-06-05 NOTE — Patient Instructions (Addendum)
Decrease hydrochlorothiazide to 12.5mg  on Monday, Wed and Fri Continue amlodipine 2.5mg  daily   Call me at [phone] with any problems Goal for blood pressure is <140/90

## 2021-06-06 LAB — BASIC METABOLIC PANEL
BUN/Creatinine Ratio: 21 (ref 10–24)
BUN: 29 mg/dL — ABNORMAL HIGH (ref 8–27)
CO2: 23 mmol/L (ref 20–29)
Calcium: 9.3 mg/dL (ref 8.6–10.2)
Chloride: 105 mmol/L (ref 96–106)
Creatinine, Ser: 1.36 mg/dL — ABNORMAL HIGH (ref 0.76–1.27)
Glucose: 147 mg/dL — ABNORMAL HIGH (ref 65–99)
Potassium: 3.8 mmol/L (ref 3.5–5.2)
Sodium: 141 mmol/L (ref 134–144)
eGFR: 50 mL/min/{1.73_m2} — ABNORMAL LOW (ref >59)

## 2021-06-23 ENCOUNTER — Emergency Department (HOSPITAL_BASED_OUTPATIENT_CLINIC_OR_DEPARTMENT_OTHER)
Admission: EM | Admit: 2021-06-23 | Discharge: 2021-06-23 | Disposition: A | Discharge status: Home / Self Care | Payer: Medicare Other | Attending: Emergency Medicine | Admitting: Emergency Medicine

## 2021-06-23 ENCOUNTER — Emergency Department (HOSPITAL_BASED_OUTPATIENT_CLINIC_OR_DEPARTMENT_OTHER): Payer: Medicare Other

## 2021-06-23 ENCOUNTER — Encounter (HOSPITAL_BASED_OUTPATIENT_CLINIC_OR_DEPARTMENT_OTHER): Payer: Self-pay | Admitting: *Deleted

## 2021-06-23 ENCOUNTER — Other Ambulatory Visit: Payer: Self-pay

## 2021-06-23 DIAGNOSIS — I10 Essential (primary) hypertension: Secondary | ICD-10-CM | POA: Diagnosis not present

## 2021-06-23 DIAGNOSIS — Z951 Presence of aortocoronary bypass graft: Secondary | ICD-10-CM | POA: Insufficient documentation

## 2021-06-23 DIAGNOSIS — M25561 Pain in right knee: Secondary | ICD-10-CM | POA: Diagnosis not present

## 2021-06-23 DIAGNOSIS — M11261 Other chondrocalcinosis, right knee: Secondary | ICD-10-CM | POA: Insufficient documentation

## 2021-06-23 DIAGNOSIS — I2510 Atherosclerotic heart disease of native coronary artery without angina pectoris: Secondary | ICD-10-CM | POA: Insufficient documentation

## 2021-06-23 DIAGNOSIS — M1711 Unilateral primary osteoarthritis, right knee: Secondary | ICD-10-CM | POA: Insufficient documentation

## 2021-06-23 MED ORDER — DICLOFENAC SODIUM 1 % EX GEL: 2.0000 g | Freq: Four times a day (QID) | CUTANEOUS | 0 refills | Status: DC | PRN

## 2021-06-23 NOTE — ED Triage Notes (Signed)
Right knee pain for a week. No injury. Pain is worse when he sits or lays down. No pain with standing or ambulation.

## 2021-06-23 NOTE — Discharge Instructions (Addendum)

## 2021-06-23 NOTE — ED Notes (Signed)
Discharge instructions reviewed with patient and family member, no further questions asked. Medication reviewed and sent to pharmacy

## 2021-06-23 NOTE — ED Provider Notes (Signed)
Emergency Department Provider Note   I have reviewed the triage vital signs and the nursing notes.   HISTORY  Chief Complaint Knee Pain   HPI Sean Kelley is a 87 y.o. male presents to the emergency department for evaluation of right knee pain.  Patient has had approximately 1 week of pain without known trauma.  Pain is hurting mainly in the front, lower right knee and radiating medially.  He is not having any posterior knee or calf pain.  No leg swelling.  No chest pain or shortness of breath.  No fevers or chills.  No joint swelling or redness.  No prior history of arthritis.  Notes pain is worse with walking up steps.   Past Medical History:  Diagnosis Date   Arm pain 02/19/2012   Bladder mass 04/16/2018   CAD (coronary artery disease)    Hx CABG   CAD (coronary artery disease) of bypass graft 02/19/2012   Chest pain 06/11/2014   DOE (dyspnea on exertion) 03/13/2018   Gastric ulcer    GERD (gastroesophageal reflux disease)    High cholesterol    HLD (hyperlipidemia) 02/19/2012   HTN (hypertension) 02/19/2012   Hypertension    Left flank pain 04/16/2018   Syncope 04/16/2018    Patient Active Problem List   Diagnosis Date Noted   Syncope 04/16/2018   Left flank pain 04/16/2018   Bladder mass 04/16/2018   DOE (dyspnea on exertion) 03/13/2018   Chest pain 06/11/2014   HTN (hypertension) 02/19/2012   Arm pain 02/19/2012   CAD (coronary artery disease) of bypass graft 02/19/2012   Other hyperlipidemia 02/19/2012    Past Surgical History:  Procedure Laterality Date   CORONARY ARTERY BYPASS GRAFT     gastric ulcer surgery      Allergies Penicillins  Family History  Problem Relation Age of Onset   Heart failure Father     Social History Social History   Tobacco Use   Smoking status: Never   Smokeless tobacco: Never  Vaping Use   Vaping Use: Never used  Substance Use Topics   Alcohol use: No    Alcohol/week: 0.0 standard drinks   Drug use: No    Review  of Systems  Constitutional: No fever/chills Musculoskeletal: Positive right knee pain.  Skin: Negative for rash. Neurological: Negative for headaches, focal weakness or numbness.   ____________________________________________   PHYSICAL EXAM:  VITAL SIGNS: ED Triage Vitals  Enc Vitals Group     BP 06/23/21 1246 (!) 182/76     Pulse Rate 06/23/21 1246 73     Resp 06/23/21 1246 18     Temp 06/23/21 1246 98.7 F (37.1 C)     Temp Source 06/23/21 1246 Oral     SpO2 06/23/21 1246 95 %     Weight 06/23/21 1244 173 lb 15.1 oz (78.9 kg)     Height 06/23/21 1244 5\' 6"  (1.676 m)   Constitutional: Alert and oriented. Well appearing and in no acute distress. Eyes: Conjunctivae are normal.  Head: Atraumatic. Nose: No congestion/rhinnorhea. Mouth/Throat: Mucous membranes are moist.   Neck: No stridor.  Cardiovascular: Good peripheral circulation. Respiratory: Normal respiratory effort.   Gastrointestinal: No distention.  Musculoskeletal: No lower extremity tenderness nor edema. No gross deformities of extremities. No joint redness or warmth. No leg swelling. No posterior knee/calf swelling or tenderness.  Neurologic:  Normal speech and language.  Skin:  Skin is warm, dry and intact. No rash noted.  ____________________________________________  RADIOLOGY  DG Knee  Complete 4 Views Right  Result Date: 06/23/2021 CLINICAL DATA:  Right knee pain for a week.  No injury. EXAM: RIGHT KNEE - COMPLETE 4+ VIEW COMPARISON:  None. FINDINGS: No evidence of acute fracture or malalignment. Mild tricompartmental osteoarthritis, most pronounced within the medial compartment. Chondrocalcinosis is seen, most evident within the lateral compartment. No knee joint effusion. Prominent vascular calcifications. No focal soft tissue swelling is seen. IMPRESSION: 1. No acute osseous abnormality of the right knee. 2. Mild tricompartmental osteoarthritis, most pronounced within the medial compartment. 3.  Chondrocalcinosis. 4. Atherosclerosis. Electronically Signed   By: Duanne Guess D.O.   On: 06/23/2021 13:23    ____________________________________________   PROCEDURES  Procedure(s) performed:   Procedures  None  ____________________________________________   INITIAL IMPRESSION / ASSESSMENT AND PLAN / ED COURSE  Pertinent labs & imaging results that were available during my care of the patient were reviewed by me and considered in my medical decision making (see chart for details).   Patient presents emergency department with right knee pain.  Tenderness is anterior and medial.  This correlates with some arthritis seen on the patient's plain film ordered from triage.  No clinical suspicion for septic joint.  Exceedingly low suspicion for DVT given location and history regarding pain.  Plan for Voltaren gel and refer to sports medicine.   ____________________________________________  FINAL CLINICAL IMPRESSION(S) / ED DIAGNOSES  Final diagnoses:  Acute pain of right knee    NEW OUTPATIENT MEDICATIONS STARTED DURING THIS VISIT:  New Prescriptions   DICLOFENAC SODIUM (VOLTAREN) 1 % GEL    Apply 2 g topically 4 (four) times daily as needed.    Note:  This document was prepared using Dragon voice recognition software and may include unintentional dictation errors.  Alona Bene, MD, [facility] Emergency Medicine    Elisabel Hanover, Arlyss Repress, MD 06/23/21 [phone]

## 2021-07-13 DIAGNOSIS — U071 COVID-19: Secondary | ICD-10-CM | POA: Diagnosis not present

## 2021-09-10 DIAGNOSIS — Z23 Encounter for immunization: Secondary | ICD-10-CM | POA: Diagnosis not present

## 2021-09-10 DIAGNOSIS — I10 Essential (primary) hypertension: Secondary | ICD-10-CM | POA: Diagnosis not present

## 2021-09-10 DIAGNOSIS — E1169 Type 2 diabetes mellitus with other specified complication: Secondary | ICD-10-CM | POA: Diagnosis not present

## 2021-09-10 DIAGNOSIS — E7800 Pure hypercholesterolemia, unspecified: Secondary | ICD-10-CM | POA: Diagnosis not present

## 2021-09-10 DIAGNOSIS — I2510 Atherosclerotic heart disease of native coronary artery without angina pectoris: Secondary | ICD-10-CM | POA: Diagnosis not present

## 2021-09-10 DIAGNOSIS — D649 Anemia, unspecified: Secondary | ICD-10-CM | POA: Diagnosis not present

## 2021-11-12 DIAGNOSIS — H40023 Open angle with borderline findings, high risk, bilateral: Secondary | ICD-10-CM | POA: Diagnosis not present

## 2022-03-15 DIAGNOSIS — E1169 Type 2 diabetes mellitus with other specified complication: Secondary | ICD-10-CM | POA: Diagnosis not present

## 2022-03-15 DIAGNOSIS — Z0000 Encounter for general adult medical examination without abnormal findings: Secondary | ICD-10-CM | POA: Diagnosis not present

## 2022-03-15 DIAGNOSIS — I2510 Atherosclerotic heart disease of native coronary artery without angina pectoris: Secondary | ICD-10-CM | POA: Diagnosis not present

## 2022-03-15 DIAGNOSIS — N1831 Chronic kidney disease, stage 3a: Secondary | ICD-10-CM | POA: Diagnosis not present

## 2022-03-15 DIAGNOSIS — I10 Essential (primary) hypertension: Secondary | ICD-10-CM | POA: Diagnosis not present

## 2022-03-15 DIAGNOSIS — Z1389 Encounter for screening for other disorder: Secondary | ICD-10-CM | POA: Diagnosis not present

## 2022-03-15 DIAGNOSIS — E7800 Pure hypercholesterolemia, unspecified: Secondary | ICD-10-CM | POA: Diagnosis not present

## 2022-03-22 DIAGNOSIS — R112 Nausea with vomiting, unspecified: Secondary | ICD-10-CM | POA: Diagnosis not present

## 2022-03-22 DIAGNOSIS — Z6827 Body mass index (BMI) 27.0-27.9, adult: Secondary | ICD-10-CM | POA: Diagnosis not present

## 2022-03-22 DIAGNOSIS — R110 Nausea: Secondary | ICD-10-CM | POA: Diagnosis not present

## 2022-03-22 DIAGNOSIS — K529 Noninfective gastroenteritis and colitis, unspecified: Secondary | ICD-10-CM | POA: Diagnosis not present

## 2022-03-22 DIAGNOSIS — I10 Essential (primary) hypertension: Secondary | ICD-10-CM | POA: Diagnosis not present

## 2022-03-22 DIAGNOSIS — B349 Viral infection, unspecified: Secondary | ICD-10-CM | POA: Diagnosis not present

## 2022-04-27 DIAGNOSIS — H35033 Hypertensive retinopathy, bilateral: Secondary | ICD-10-CM | POA: Diagnosis not present

## 2022-05-10 ENCOUNTER — Other Ambulatory Visit: Payer: Self-pay | Admitting: Interventional Cardiology

## 2022-06-14 ENCOUNTER — Telehealth: Payer: Self-pay | Admitting: Interventional Cardiology

## 2022-06-14 MED ORDER — AMLODIPINE BESYLATE 2.5 MG PO TABS: 2.5000 mg | ORAL_TABLET | Freq: Every day | ORAL | 0 refills | Status: DC

## 2022-06-14 NOTE — Telephone Encounter (Signed)
Pt's medication was sent to pt's pharmacy as requested. Confirmation received.  °

## 2022-06-14 NOTE — Telephone Encounter (Signed)
*  STAT* If patient is at the pharmacy, call can be transferred to refill team.   1. Which medications need to be refilled? (please list name of each medication and dose if known)   amLODipine (NORVASC) 2.5 MG tablet    2. Which pharmacy/location (including street and city if local pharmacy) is medication to be sent to? [facility] [address] [place] - [address]  3. Do they need a 30 day or 90 day supply?  90 day   Pt has scheduled appt for 08/09/22 with Dr. Izora Ribas

## 2022-06-17 ENCOUNTER — Other Ambulatory Visit: Payer: Self-pay | Admitting: Interventional Cardiology

## 2022-06-18 MED ORDER — HYDROCHLOROTHIAZIDE 12.5 MG PO CAPS: 12.5000 mg | ORAL_CAPSULE | ORAL | 0 refills | Status: DC

## 2022-08-03 NOTE — Progress Notes (Unsigned)
Cardiology Office Note:    Date:  08/04/2022   ID:  Sean Kelley, DOB December 25, 1929, MRN [id number]  PCP:  Renford Dills, MD  Cardiologist:  Lesleigh Noe, MD   Referring MD: Renford Dills, MD   Chief Complaint  Patient presents with   Coronary Artery Disease   Hypertension    History of Present Illness:    Sean Kelley is a 88 y.o. male with a hx of CAD with prior CABG x 02 June 2008, hypertension, hyperlipidemia, history of gastric ulcer disease, and syncope with negative workup.  He is doing well.  He has no cardiac complaints.  He did bring a log of his blood pressures taken randomly over the last several months.  The blood pressures tend to range between 135 and 160 mmHg systolic (only 1 BP recording above 150 mmHg).  Diastolic pressures all less than 70 mmHg benign less than 55.  He denies angina, orthopnea, edema, palpitations, syncope, and claudication.  No transient neurological symptoms.  Past Medical History:  Diagnosis Date   Arm pain 02/19/2012   Bladder mass 04/16/2018   CAD (coronary artery disease)    Hx CABG   CAD (coronary artery disease) of bypass graft 02/19/2012   Chest pain 06/11/2014   DOE (dyspnea on exertion) 03/13/2018   Gastric ulcer    GERD (gastroesophageal reflux disease)    High cholesterol    HLD (hyperlipidemia) 02/19/2012   HTN (hypertension) 02/19/2012   Hypertension    Left flank pain 04/16/2018   Syncope 04/16/2018    Past Surgical History:  Procedure Laterality Date   CORONARY ARTERY BYPASS GRAFT     gastric ulcer surgery      Current Medications: Current Meds  Medication Sig   amLODipine (NORVASC) 2.5 MG tablet Take 1 tablet (2.5 mg total) by mouth daily.   aspirin EC 81 MG tablet Take 81 mg by mouth daily.   diclofenac Sodium (VOLTAREN) 1 % GEL Apply 2 g topically 4 (four) times daily as needed.   hydrochlorothiazide (MICROZIDE) 12.5 MG capsule Take 1 capsule (12.5 mg total) by mouth every Monday, Wednesday, and Friday.    simvastatin (ZOCOR) 20 MG tablet Take 20 mg by mouth at bedtime.   tamsulosin (FLOMAX) 0.4 MG CAPS capsule Take 0.4 mg by mouth at bedtime.      Allergies:   Niacin and Penicillins   Social History   Socioeconomic History   Marital status: Married    Spouse name: Not on file   Number of children: Not on file   Years of education: Not on file   Highest education level: Not on file  Occupational History   Not on file  Tobacco Use   Smoking status: Never   Smokeless tobacco: Never  Vaping Use   Vaping Use: Never used  Substance and Sexual Activity   Alcohol use: No    Alcohol/week: 0.0 standard drinks of alcohol   Drug use: No   Sexual activity: Not on file  Other Topics Concern   Not on file  Social History Narrative   Not on file   Social Determinants of Health   Financial Resource Strain: Not on file  Food Insecurity: Not on file  Transportation Needs: Not on file  Physical Activity: Not on file  Stress: Not on file  Social Connections: Not on file     Family History: The patient's family history includes Heart failure in his father.  ROS:   Please see the history of  present illness.    No complaints.  He does experience some dyspnea with climbing more than 2 flights of stairs.  All other systems reviewed and are negative.  EKGs/Labs/Other Studies Reviewed:    The following studies were reviewed today: ECHOCARDIOGRAM 2019: Study Conclusions   - Left ventricle: The cavity size was normal. Wall thickness was    increased in a pattern of mild LVH. Systolic function was normal.    The estimated ejection fraction was in the range of 60% to 65%.    Wall motion was normal; there were no regional wall motion    abnormalities. Findings consistent with left ventricular    diastolic dysfunction, grade indeterminate.  - Aortic valve: Trileaflet; mildly thickened, mildly calcified    leaflets. Sclerosis without stenosis. There was mild    regurgitation.  - Aorta:  Mild aortic root and ascending aortic dilatation.  - Right ventricle: The cavity size was mildly dilated. Wall    thickness was normal. Systolic function was mildly reduced.  - Right atrium: The atrium was mildly dilated.  - Atrial septum: The septum bowed from right to left, consistent    with increased right atrial pressure. No defect or patent foramen    ovale was identified.  - Tricuspid valve: There was mild regurgitation.  - Pulmonic valve: There was mild to moderate regurgitation.  - Pulmonary arteries: PA peak pressure: 40 mm Hg (S).   EKG:  EKG sinus rhythm, left anterior hemiblock, poor R wave progression, atrial premature contractions.  Lateral T wave changes are unchanged when compared with January 2022.  Recent Labs: No results found for requested labs within last 365 days.  Recent Lipid Panel    Component Value Date/Time   CHOL 134 02/20/2012 0605   TRIG 82 02/20/2012 0605   HDL 40 02/20/2012 0605   CHOLHDL 3.4 02/20/2012 0605   VLDL 16 02/20/2012 0605   LDLCALC 78 02/20/2012 0605    Physical Exam:    VS:  BP (!) 162/80   Pulse 66   Ht 5\' 6"  (1.676 m)   Wt 179 lb 6.4 oz (81.4 kg)   SpO2 95%   BMI 28.96 kg/m     Wt Readings from Last 3 Encounters:  08/04/22 179 lb 6.4 oz (81.4 kg)  06/23/21 173 lb 15.1 oz (78.9 kg)  05/08/21 174 lb (78.9 kg)     GEN: Appears younger than his stated age. No acute distress HEENT: Normal NECK: No JVD. LYMPHATICS: No lymphadenopathy CARDIAC: No murmur. RRR S4 but no S3 gallop, or edema. VASCULAR:  Normal Pulses. No bruits. RESPIRATORY:  Clear to auscultation without rales, wheezing or rhonchi  ABDOMEN: Soft, non-tender, non-distended, No pulsatile mass, MUSCULOSKELETAL: No deformity  SKIN: Warm and dry NEUROLOGIC:  Alert and oriented x 3 PSYCHIATRIC:  Normal affect   ASSESSMENT:    1. Coronary artery disease involving coronary bypass graft of native heart without angina pectoris   2. Primary hypertension   3. Other  hyperlipidemia   4. Nonspecific abnormal electrocardiogram (ECG) (EKG)    PLAN:    In order of problems listed above:  Secondary prevention discussed.  Most recent LDL in April was 81.  No recent A1c.  Continue current therapy. May need to further increase amlodipine to 5 mg/day depending upon blood pressures at home.  Systolic blood pressure range 130 to 150 mmHg.  Twice today he was elevated at above 160 mmHg. Continue continue statin therapy. 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  PACs.  Overall education and awareness concerning primary/secondary risk prevention was discussed in detail: LDL less than 70, hemoglobin A1c less than 7, blood pressure target less than 130/80 mmHg, >150 minutes of moderate aerobic activity per week, avoidance of smoking, weight control (via diet and exercise), and continued surveillance/management of/for obstructive sleep apnea.  Target BP: <130/80 mmHg  Diet and lifestyle measures for BP control were reviewed in detail: Low sodium diet (<2.5 gm daily); alcohol restriction (<3 ounces per day); weight loss (Mediterranean); avoid non-steroidal agents; > 6 hours sleep per day; 150 min moderate exercise per week. Medical regimen will include at least 2 agents. Resistant hypertension if not controlled on 3 agents. Consider further evaluation: Sleep study to r/o OSA; Renal angiogram; Primary hyperaldonism and Pheochromocytoma w/u. After 3 agents, consider MRA (spironolactone)/ Epleronone), hydralazine, beta-blocker, and Minoxidil if not already in use due to patient profile. He will measure blood pressure for several days at least 2 hours after his a.m. medications.  We may need to increase amlodipine to 5 mg/day.    Medication Adjustments/Labs and Tests Ordered: Current medicines are reviewed at length with the patient today.  Concerns regarding medicines are outlined above.  Orders Placed This Encounter  Procedures   EKG 12-Lead   No orders  of the defined types were placed in this encounter.   Patient Instructions  Medication Instructions:  Your physician recommends that you continue on your current medications as directed. Please refer to the Current Medication list given to you today.  *If you need a refill on your cardiac medications before your next appointment, please call your pharmacy*  Lab Work: NONE  Testing/Procedures: NONE  Follow-Up: At [facility], you and your health needs are our priority.  As part of our continuing mission to provide you with exceptional heart care, we have created designated Provider Care Teams.  These Care Teams include your primary Cardiologist (physician) and Advanced Practice Providers (APPs -  Physician Assistants and Nurse Practitioners) who all work together to provide you with the care you need, when you need it.  Your next appointment:   1 year(s)  The format for your next appointment:   In Person  Provider:   Lesleigh Noe, MD    Other Instructions Please recheck your blood pressure at home and call in your reading to our office at [phone].  Your goal for blood pressure is 140/80 mmHg or less.  Important Information About Sugar         Signed, Lesleigh Noe, MD  08/04/2022 12:06 PM    [facility]

## 2022-08-04 ENCOUNTER — Encounter: Payer: Self-pay | Admitting: Interventional Cardiology

## 2022-08-04 ENCOUNTER — Ambulatory Visit: Payer: Medicare Other | Attending: Interventional Cardiology | Admitting: Interventional Cardiology

## 2022-08-04 VITALS — BP 162/80 | HR 66 | Ht 66.0 in | Wt 179.4 lb

## 2022-08-04 DIAGNOSIS — E7849 Other hyperlipidemia: Secondary | ICD-10-CM | POA: Diagnosis not present

## 2022-08-04 DIAGNOSIS — R9431 Abnormal electrocardiogram [ECG] [EKG]: Secondary | ICD-10-CM | POA: Diagnosis not present

## 2022-08-04 DIAGNOSIS — R55 Syncope and collapse: Secondary | ICD-10-CM

## 2022-08-04 DIAGNOSIS — I25810 Atherosclerosis of coronary artery bypass graft(s) without angina pectoris: Secondary | ICD-10-CM | POA: Diagnosis not present

## 2022-08-04 DIAGNOSIS — I10 Essential (primary) hypertension: Secondary | ICD-10-CM | POA: Insufficient documentation

## 2022-08-04 NOTE — Patient Instructions (Signed)
Medication Instructions:  Your physician recommends that you continue on your current medications as directed. Please refer to the Current Medication list given to you today.  *If you need a refill on your cardiac medications before your next appointment, please call your pharmacy*  Lab Work: NONE  Testing/Procedures: NONE  Follow-Up: At [facility], you and your health needs are our priority.  As part of our continuing mission to provide you with exceptional heart care, we have created designated Provider Care Teams.  These Care Teams include your primary Cardiologist (physician) and Advanced Practice Providers (APPs -  Physician Assistants and Nurse Practitioners) who all work together to provide you with the care you need, when you need it.  Your next appointment:   1 year(s)  The format for your next appointment:   In Person  Provider:   Lesleigh Noe, MD    Other Instructions Please recheck your blood pressure at home and call in your reading to our office at [phone].  Your goal for blood pressure is 140/80 mmHg or less.  Important Information About Sugar

## 2022-08-06 ENCOUNTER — Telehealth: Payer: Self-pay | Admitting: Interventional Cardiology

## 2022-08-06 NOTE — Telephone Encounter (Signed)
Returned call to patient and verified BP readings.  9/8 -- 143/65, HR 73 9/7 --- 138/76, HR 70 9/6 (at office visit) 161/64, HR 66  Will forward to Dr. Katrinka Blazing to review.

## 2022-08-06 NOTE — Telephone Encounter (Signed)
Pt c/o BP issue: STAT if pt c/o blurred vision, one-sided weakness or slurred speech  1. What are your last 5 BP readings?  143/65 HR 73 138/76 HR 70 161/64 -- At office   2. Are you having any other symptoms (ex. Dizziness, headache, blurred vision, passed out)?   3. What is your BP issue? Pt states that Dr.  Katrinka Blazing wanted patient to call with BP readings.

## 2022-08-09 ENCOUNTER — Ambulatory Visit: Payer: Medicare Other | Admitting: Internal Medicine

## 2022-08-10 NOTE — Telephone Encounter (Signed)
Blood pressure is okay for age.

## 2022-08-12 NOTE — Telephone Encounter (Signed)
Spoke with patient, per Dr. Katrinka Blazing: Blood pressure is okay for age.  Patient verbalized understanding and expressed appreciation for call.

## 2022-08-25 ENCOUNTER — Other Ambulatory Visit: Payer: Self-pay

## 2022-08-25 MED ORDER — HYDROCHLOROTHIAZIDE 12.5 MG PO CAPS: 12.5000 mg | ORAL_CAPSULE | ORAL | 3 refills | Status: DC

## 2022-09-20 ENCOUNTER — Other Ambulatory Visit: Payer: Self-pay | Admitting: Interventional Cardiology

## 2022-09-29 DIAGNOSIS — E7800 Pure hypercholesterolemia, unspecified: Secondary | ICD-10-CM | POA: Diagnosis not present

## 2022-09-29 DIAGNOSIS — N1831 Chronic kidney disease, stage 3a: Secondary | ICD-10-CM | POA: Diagnosis not present

## 2022-09-29 DIAGNOSIS — Z23 Encounter for immunization: Secondary | ICD-10-CM | POA: Diagnosis not present

## 2022-09-29 DIAGNOSIS — I2510 Atherosclerotic heart disease of native coronary artery without angina pectoris: Secondary | ICD-10-CM | POA: Diagnosis not present

## 2022-09-29 DIAGNOSIS — E1169 Type 2 diabetes mellitus with other specified complication: Secondary | ICD-10-CM | POA: Diagnosis not present

## 2022-12-29 DIAGNOSIS — H40023 Open angle with borderline findings, high risk, bilateral: Secondary | ICD-10-CM | POA: Diagnosis not present

## 2023-03-21 DIAGNOSIS — R0602 Shortness of breath: Secondary | ICD-10-CM | POA: Diagnosis not present

## 2023-03-21 DIAGNOSIS — N1831 Chronic kidney disease, stage 3a: Secondary | ICD-10-CM | POA: Diagnosis not present

## 2023-03-21 DIAGNOSIS — E7800 Pure hypercholesterolemia, unspecified: Secondary | ICD-10-CM | POA: Diagnosis not present

## 2023-03-21 DIAGNOSIS — Z0000 Encounter for general adult medical examination without abnormal findings: Secondary | ICD-10-CM | POA: Diagnosis not present

## 2023-03-21 DIAGNOSIS — Z1331 Encounter for screening for depression: Secondary | ICD-10-CM | POA: Diagnosis not present

## 2023-03-21 DIAGNOSIS — E1169 Type 2 diabetes mellitus with other specified complication: Secondary | ICD-10-CM | POA: Diagnosis not present

## 2023-03-21 DIAGNOSIS — I2510 Atherosclerotic heart disease of native coronary artery without angina pectoris: Secondary | ICD-10-CM | POA: Diagnosis not present

## 2023-04-11 DIAGNOSIS — H26492 Other secondary cataract, left eye: Secondary | ICD-10-CM | POA: Diagnosis not present

## 2023-04-11 DIAGNOSIS — H40023 Open angle with borderline findings, high risk, bilateral: Secondary | ICD-10-CM | POA: Diagnosis not present

## 2023-04-11 DIAGNOSIS — H31011 Macula scars of posterior pole (postinflammatory) (post-traumatic), right eye: Secondary | ICD-10-CM | POA: Diagnosis not present

## 2023-04-11 DIAGNOSIS — H26493 Other secondary cataract, bilateral: Secondary | ICD-10-CM | POA: Diagnosis not present

## 2023-04-11 DIAGNOSIS — H468 Other optic neuritis: Secondary | ICD-10-CM | POA: Diagnosis not present

## 2023-04-16 ENCOUNTER — Emergency Department (HOSPITAL_BASED_OUTPATIENT_CLINIC_OR_DEPARTMENT_OTHER): Payer: Medicare Other

## 2023-04-16 ENCOUNTER — Encounter (HOSPITAL_BASED_OUTPATIENT_CLINIC_OR_DEPARTMENT_OTHER): Payer: Self-pay | Admitting: Emergency Medicine

## 2023-04-16 ENCOUNTER — Other Ambulatory Visit: Payer: Self-pay

## 2023-04-16 ENCOUNTER — Emergency Department (HOSPITAL_BASED_OUTPATIENT_CLINIC_OR_DEPARTMENT_OTHER)
Admission: EM | Admit: 2023-04-16 | Discharge: 2023-04-16 | Disposition: A | Discharge status: Home / Self Care | Payer: Medicare Other | Attending: Emergency Medicine | Admitting: Emergency Medicine

## 2023-04-16 DIAGNOSIS — R002 Palpitations: Secondary | ICD-10-CM | POA: Diagnosis not present

## 2023-04-16 DIAGNOSIS — Z7982 Long term (current) use of aspirin: Secondary | ICD-10-CM | POA: Insufficient documentation

## 2023-04-16 DIAGNOSIS — I10 Essential (primary) hypertension: Secondary | ICD-10-CM | POA: Insufficient documentation

## 2023-04-16 DIAGNOSIS — Z79899 Other long term (current) drug therapy: Secondary | ICD-10-CM | POA: Insufficient documentation

## 2023-04-16 DIAGNOSIS — I2510 Atherosclerotic heart disease of native coronary artery without angina pectoris: Secondary | ICD-10-CM | POA: Diagnosis not present

## 2023-04-16 DIAGNOSIS — J986 Disorders of diaphragm: Secondary | ICD-10-CM

## 2023-04-16 DIAGNOSIS — K3184 Gastroparesis: Secondary | ICD-10-CM | POA: Diagnosis not present

## 2023-04-16 DIAGNOSIS — R0602 Shortness of breath: Secondary | ICD-10-CM | POA: Diagnosis not present

## 2023-04-16 DIAGNOSIS — R0600 Dyspnea, unspecified: Secondary | ICD-10-CM | POA: Diagnosis not present

## 2023-04-16 DIAGNOSIS — Z951 Presence of aortocoronary bypass graft: Secondary | ICD-10-CM | POA: Diagnosis not present

## 2023-04-16 LAB — BASIC METABOLIC PANEL
Anion gap: 9 (ref 5–15)
BUN: 27 mg/dL — ABNORMAL HIGH (ref 8–23)
CO2: 26 mmol/L (ref 22–32)
Calcium: 8.8 mg/dL — ABNORMAL LOW (ref 8.9–10.3)
Chloride: 102 mmol/L (ref 98–111)
Creatinine, Ser: 1.39 mg/dL — ABNORMAL HIGH (ref 0.61–1.24)
GFR, Estimated: 48 mL/min — ABNORMAL LOW (ref >60)
Glucose, Bld: 145 mg/dL — ABNORMAL HIGH (ref 70–99)
Potassium: 3.9 mmol/L (ref 3.5–5.1)
Sodium: 137 mmol/L (ref 135–145)

## 2023-04-16 LAB — CBC
HCT: 38.8 % — ABNORMAL LOW (ref 39.0–52.0)
Hemoglobin: 12.7 g/dL — ABNORMAL LOW (ref 13.0–17.0)
MCH: 32.9 pg (ref 26.0–34.0)
MCHC: 32.7 g/dL (ref 30.0–36.0)
MCV: 100.5 fL — ABNORMAL HIGH (ref 80.0–100.0)
Platelets: 208 10*3/uL (ref 150–400)
RBC: 3.86 MIL/uL — ABNORMAL LOW (ref 4.22–5.81)
RDW: 14.1 % (ref 11.5–15.5)
WBC: 6.7 10*3/uL (ref 4.0–10.5)
nRBC: 0 % (ref 0.0–0.2)

## 2023-04-16 LAB — BRAIN NATRIURETIC PEPTIDE: B Natriuretic Peptide: 62.8 pg/mL (ref 0.0–100.0)

## 2023-04-16 LAB — TROPONIN I (HIGH SENSITIVITY)
Troponin I (High Sensitivity): 6 ng/L (ref <18)
Troponin I (High Sensitivity): 7 ng/L (ref <18)

## 2023-04-16 MED ORDER — IOHEXOL 350 MG/ML SOLN
100.0000 mL | Freq: Once | INTRAVENOUS | Status: AC | PRN
  Administered 2023-04-16: 100 mL via INTRAVENOUS

## 2023-04-16 MED ORDER — METOCLOPRAMIDE HCL 10 MG PO TABS: 5.0000 mg | ORAL_TABLET | Freq: Four times a day (QID) | ORAL | 0 refills | Status: AC | PRN

## 2023-04-16 MED ORDER — FAMOTIDINE IN NACL 20-0.9 MG/50ML-% IV SOLN
20.0000 mg | Freq: Once | INTRAVENOUS | Status: AC
  Administered 2023-04-16: 20 mg via INTRAVENOUS
  Filled 2023-04-16: qty 50

## 2023-04-16 MED ORDER — ALBUTEROL SULFATE HFA 108 (90 BASE) MCG/ACT IN AERS: 2.0000 | INHALATION_SPRAY | RESPIRATORY_TRACT | Status: DC | PRN

## 2023-04-16 MED ORDER — PANTOPRAZOLE SODIUM 20 MG PO TBEC: 20.0000 mg | DELAYED_RELEASE_TABLET | Freq: Every day | ORAL | 0 refills | Status: AC

## 2023-04-16 MED ORDER — ALUM & MAG HYDROXIDE-SIMETH 200-200-20 MG/5ML PO SUSP
30.0000 mL | Freq: Once | ORAL | Status: AC
  Administered 2023-04-16: 30 mL via ORAL
  Filled 2023-04-16: qty 30

## 2023-04-16 MED ORDER — FENTANYL CITRATE PF 50 MCG/ML IJ SOSY: 12.5000 ug | PREFILLED_SYRINGE | Freq: Once | INTRAMUSCULAR | Status: DC | PRN

## 2023-04-16 NOTE — ED Triage Notes (Signed)
Felt palpitation last night , took nitro and felt relief . Toady he reports shortness of breath with some nausea he said , Hx CABG 15 years ago . Denies chest pain

## 2023-04-16 NOTE — ED Provider Notes (Signed)
[place] [facility] Provider Note   CSN: [id number] Arrival date & time: 04/16/23  1432     History  Chief Complaint  Patient presents with   Shortness of Breath    Sean Kelley is a 89 y.o. male. With pmh CAD with prior CABG x 02 June 2008, hypertension, hyperlipidemia, PUD presenting with dyspnea on exertion, fatigue and episode of palpitations.  Patient says he never had history of chest pain with previous CABG.  He found out he had a blockage in the past when he felt more fatigued and rundown than usual.  Over the past 2 days he has been experiencing more dyspnea on exertion and feeling more winded than usual.  He also experience some orthopnea last night.  He had an episode of palpitations last night felt like he was in irregular rhythm but no longer feels numb.  He has had no chest pain with exertion or chest pain with this episode.  No active chest pain on exam.  No fevers, no chills, no cough.  No leg pain or swelling noted.  No history of CHF.  No history of A-fib or other arrhythmia.  He takes daily baby aspirin.  He woke up in a sweat this morning.  He has had nausea but no vomiting.  Denies any abdominal pain.   Shortness of Breath      Home Medications Prior to Admission medications   Medication Sig Start Date End Date Taking? Authorizing Provider  metoCLOPramide (REGLAN) 10 MG tablet Take 0.5 tablets (5 mg total) by mouth every 6 (six) hours as needed for nausea. 04/16/23  Yes Mardene Sayer, MD  pantoprazole (PROTONIX) 20 MG tablet Take 1 tablet (20 mg total) by mouth daily. 04/16/23  Yes Mardene Sayer, MD  amLODipine (NORVASC) 2.5 MG tablet Take 1 tablet by mouth once daily 09/20/22   Lyn Records, MD  aspirin EC 81 MG tablet Take 81 mg by mouth daily.    [provider]  diclofenac Sodium (VOLTAREN) 1 % GEL Apply 2 g topically 4 (four) times daily as needed. 06/23/21   Long, Arlyss Repress, MD  hydrochlorothiazide  (MICROZIDE) 12.5 MG capsule Take 1 capsule (12.5 mg total) by mouth every Monday, Wednesday, and Friday. 08/25/22   Lyn Records, MD  simvastatin (ZOCOR) 20 MG tablet Take 20 mg by mouth at bedtime.    [provider]  tamsulosin (FLOMAX) 0.4 MG CAPS capsule Take 0.4 mg by mouth at bedtime.  03/21/14   [provider]      Allergies    Niacin and Penicillins    Review of Systems   Review of Systems  Respiratory:  Positive for shortness of breath.     Physical Exam Updated Vital Signs BP (!) 179/79   Pulse (!) 56   Temp 97.8 F (36.6 C)   Resp 13   Wt 81.6 kg   SpO2 97%   BMI 29.05 kg/m  Physical Exam Constitutional: Alert and oriented. Well appearing and in no distress. Eyes: Conjunctivae are normal. ENT      Head: Normocephalic and atraumatic. Cardiovascular: S1, S2,  Normal and symmetric distal pulses are present in all extremities.Warm and well perfused. Respiratory: Normal respiratory effort. Breath sounds are normal.  O2 sat 96 on RA Gastrointestinal: Soft and nontender.  Musculoskeletal: Normal range of motion in all extremities.      Right lower leg: No tenderness or edema.      Left  lower leg: No tenderness or edema. Neurologic: Normal speech and language.  No facial droop.  Moving all 4 extremities equally.  Sensation grossly intact.  No gross focal neurologic deficits are appreciated. Skin: Skin is warm, dry and intact. No rash noted. Psychiatric: Mood and affect are normal. Speech and behavior are normal.  ED Results / Procedures / Treatments   Labs (all labs ordered are listed, but only abnormal results are displayed) Labs Reviewed  BASIC METABOLIC PANEL - Abnormal; Notable for the following components:      Result Value   Glucose, Bld 145 (*)    BUN 27 (*)    Creatinine, Ser 1.39 (*)    Calcium 8.8 (*)    GFR, Estimated 48 (*)    All other components within normal limits  CBC - Abnormal; Notable for the following components:   RBC  3.86 (*)    Hemoglobin 12.7 (*)    HCT 38.8 (*)    MCV 100.5 (*)    All other components within normal limits  BRAIN NATRIURETIC PEPTIDE  TROPONIN I (HIGH SENSITIVITY)  TROPONIN I (HIGH SENSITIVITY)    EKG EKG Interpretation  Date/Time:  Saturday Apr 16 2023 14:42:22 EDT Ventricular Rate:  70 PR Interval:  200 QRS Duration: 87 QT Interval:  414 QTC Calculation: 447 R Axis:   258 Text Interpretation: Sinus rhythm Left anterior fascicular block Anterior infarct, old ST elevation, consider inferior injury No significant change since last tracing Confirmed by Jacalyn Lefevre [phone]) on 04/16/2023 2:44:48 PM  Radiology CT Angio Chest PE W and/or Wo Contrast  Result Date: 04/16/2023 CLINICAL DATA:  Worsening shortness of breath and palpitations. Nausea. EXAM: CT ANGIOGRAPHY CHEST WITH CONTRAST TECHNIQUE: Multidetector CT imaging of the chest was performed using the standard protocol during bolus administration of intravenous contrast. Multiplanar CT image reconstructions and MIPs were obtained to evaluate the vascular anatomy. RADIATION DOSE REDUCTION: This exam was performed according to the departmental dose-optimization program which includes automated exposure control, adjustment of the mA and/or kV according to patient size and/or use of iterative reconstruction technique. CONTRAST:  OMNIPAQUE IOHEXOL 350 MG/ML SOLN COMPARISON:  04/16/2018 FINDINGS: Cardiovascular: Satisfactory opacification of pulmonary arteries noted, and no pulmonary emboli identified. No evidence of thoracic aortic dissection or aneurysm. Aortic and coronary atherosclerotic calcification incidentally noted. Prior CABG. Mediastinum/Nodes: No masses or pathologically enlarged lymph nodes identified. Lungs/Pleura: Chronic elevation of right hemidiaphragm is stable. No pulmonary mass, infiltrate, or effusion. Upper abdomen: Stomach is distended and contains a significant amount of food stuff. Musculoskeletal: No  suspicious bone lesions identified. Review of the MIP images confirms the above findings. IMPRESSION: No evidence of pulmonary embolism or other active lung disease. Stable chronic elevation of right hemidiaphragm. Distended stomach, with significant amount of food stuff. Suggest clinical correlation for symptoms or signs of gastroparesis or gastric outlet obstruction. Electronically Signed   By: Danae Orleans M.D.   On: 04/16/2023 17:17   DG Chest 2 View  Result Date: 04/16/2023 CLINICAL DATA:  Shortness of breath. EXAM: CHEST - 2 VIEW COMPARISON:  Two-view chest x-ray 04/16/2018 FINDINGS: The heart size is normal. Chronic elevation of the right hemidiaphragm is stable. No edema or effusion is present. No focal airspace disease is present. IMPRESSION: 1. No acute cardiopulmonary disease. 2. Chronic elevation of the right hemidiaphragm. Electronically Signed   By: Marin Roberts M.D.   On: 04/16/2023 15:38    Procedures Procedures    Medications Ordered in ED Medications  iohexol (OMNIPAQUE) 350 MG/ML  injection 100 mL (100 mLs Intravenous Contrast Given 04/16/23 1653)  famotidine (PEPCID) IVPB 20 mg premix (0 mg Intravenous Stopped 04/16/23 1825)  alum & mag hydroxide-simeth (MAALOX/MYLANTA) 200-200-20 MG/5ML suspension 30 mL (30 mLs Oral Given 04/16/23 1727)    ED Course/ Medical Decision Making/ A&P Clinical Course as of 04/17/23 1224  Sat Apr 16, 2023  1805 I had long discussion with patient and patient's daughter at bedside regarding findings on CT PE concerning for gastroparesis or gastric output.  However clinically, patient does not have signs of gastric outlet obstruction at this time.  He has had some nausea but no vomiting.  He is tolerating all p.o.  Down his GI cocktail with no discomfort or difficulties.  He is having normal bowel movements.  I recommended we put an NG tube to suction out material and plan and admit for observation and inpatient consult however he declines at this  time.  He would rather watch his symptoms and try liquid diet.  Will give a course of Reglan and Protonix and urgent referral to GI.  He will also follow-up with his cardiologist regarding symptoms today.  We did discuss strict return precautions which patient and patient's daughter in agreement with. [VB]    Clinical Course User Index [VB] Mardene Sayer, MD                             Medical Decision Making JOJO SIFERS is a 89 y.o. male. With pmh CAD with prior CABG x 02 June 2008, hypertension, hyperlipidemia, PUD presenting with dyspnea on exertion, fatigue and episode of palpitations.  Follows with Dr. Verdis Prime cardiology [facility].   Patient with nonspecific symptoms and no chest pain as listed above.  However he has had previous MI with atypical symptoms.  His heart score today is 4.  There were no acute changes on his EKG noted by me and had 2 times reassuring sensitive to the troponins initial 6 repeat 7 with heart score of 4, low suspicion for unstable angina or ACS because of patient's symptoms.  His chest x-ray reviewed here with right hemidiaphragm elevation chronic in nature but no acute findings such as pneumothorax, pleural effusion or pneumonia contributing to the shortness of breath and nausea.  He does not appear fluid overloaded, his BNP is unremarkable, doubt CHF exacerbation.  I had no concern for dissection with no reported pain no pulse deficits and well nontoxic appearance and no focal neurologic deficits.  I followed up with CTA PE study which was negative for PE but notable for once again chronic Hemi right diaphragm elevation which likely could contribute to patient's shortness of breath as well as significantly distended stomach with food stuff inside.   I had long discussion with patient and patient's daughter at bedside regarding findings on CT PE concerning for gastroparesis or gastric output.  However clinically, patient does not have signs of gastric outlet  obstruction at this time.  He has had some nausea but no vomiting.  He is tolerating all p.o.  Down his GI cocktail with no discomfort or difficulties.  He is having normal bowel movements.  I recommended we put an NG tube to suction out material and plan and admit for observation and inpatient consult however he declines at this time.  He would rather watch his symptoms and try liquid diet.  Will give a course of Reglan and Protonix and urgent referral to GI.  He will also follow-up with his cardiologist regarding symptoms today.  We did discuss strict return precautions which patient and patient's daughter in agreement with. [VB]   Amount and/or Complexity of Data Reviewed Labs: ordered. Radiology: ordered.  Risk OTC drugs. Prescription drug management.     Final Clinical Impression(s) / ED Diagnoses Final diagnoses:  Dyspnea, unspecified type  Elevated hemidiaphragm  Gastroparesis    Rx / DC Orders ED Discharge Orders          Ordered    metoCLOPramide (REGLAN) 10 MG tablet  Every 6 hours PRN        04/16/23 1804    pantoprazole (PROTONIX) 20 MG tablet  Daily        04/16/23 1804    Ambulatory referral to Gastroenterology       Comments: Signs of gastric dilation and gastroparesis on CT   04/16/23 1804              Mardene Sayer, MD 04/17/23 1224

## 2023-04-16 NOTE — ED Notes (Signed)
Patient did not need or want a breathing treatment at this time

## 2023-04-16 NOTE — Discharge Instructions (Addendum)
You were seen for shortness of breath.  As we discussed, your cardiac workup was reassuring however I do recommend you still call your cardiologist for outpatient follow-up.   Your CT scan showed no evidence of pneumonia, no fluid on the lungs or other abnormality in the lungs such as blood clot.  However it did show chronically elevated hemidiaphragm on the right which could contribute to your shortness of breath as well as a very dilated stomach full of food content.  This can lead to what we call a gastric outlet obstruction and you should certainly return if you develop any vomiting, chest pain, abdominal pain or inability to keep any food down.  I have started you on a medicine called Reglan to help with any nausea and to help move food contents along.  You may want to try a liquid diet over the next few days.  You need to call GI for outpatient follow-up which I have referred you to.  Come back if any severe worsening chest pain, shortness of breath, fainting, uncontrollable nausea vomiting, sweating, or any other symptoms concerning to you.  CT scan read: Distended stomach, with significant amount of food stuff. Suggest  clinical correlation for symptoms or signs of gastroparesis or  gastric outlet obstruction.

## 2023-04-28 DIAGNOSIS — H35033 Hypertensive retinopathy, bilateral: Secondary | ICD-10-CM | POA: Diagnosis not present

## 2023-04-28 DIAGNOSIS — H26493 Other secondary cataract, bilateral: Secondary | ICD-10-CM | POA: Diagnosis not present

## 2023-04-28 DIAGNOSIS — H26491 Other secondary cataract, right eye: Secondary | ICD-10-CM | POA: Diagnosis not present

## 2023-07-28 DIAGNOSIS — H35033 Hypertensive retinopathy, bilateral: Secondary | ICD-10-CM | POA: Diagnosis not present

## 2023-07-28 DIAGNOSIS — H26493 Other secondary cataract, bilateral: Secondary | ICD-10-CM | POA: Diagnosis not present

## 2023-08-02 ENCOUNTER — Encounter: Payer: Self-pay | Admitting: Cardiovascular Disease

## 2023-08-02 NOTE — Progress Notes (Signed)
  Cardiology Office Note:  .   Date:  08/03/2023  ID:  Sean Kelley, DOB [birth year], MRN [id number] PCP: Sean Dills, MD  [facility] Providers Cardiologist: Sean Kelley , now Sean Kelley  Click to update primary MD,subspecialty MD or APP then REFRESH:1}   History of Present Illness: .   Sean Kelley is a 89 y.o. male former patient of Dr. Katrinka Kelley Hx of CAD, CABG in July 2009 HLD Has been having episodes of worsening dyspnea , HTN  No CP   Mild DOE when he walks up hill . Walks every day,  mows, works in his garden  BP is mildly elevated here in the office  At home,  gets 140s/ 70s   Still eating some salty foods ( hot dogs 1-2 times a week )        ROS:   Studies Reviewed: .         Risk Assessment/Calculations:     HYPERTENSION CONTROL Vitals:   08/03/23 1034 08/03/23 1059  BP: (!) 164/70 (!) 160/70    The patient's blood pressure is elevated above target today.  In order to address the patient's elevated BP: A current anti-hypertensive medication was adjusted today.          Physical Exam:   VS:  BP (!) 160/70   Pulse 78   Ht 5\' 6"  (1.676 m)   Wt 173 lb 9.6 oz (78.7 kg)   SpO2 95%   BMI 28.02 kg/m    Wt Readings from Last 3 Encounters:  08/03/23 173 lb 9.6 oz (78.7 kg)  04/16/23 180 lb (81.6 kg)  08/04/22 179 lb 6.4 oz (81.4 kg)    GEN: Well nourished, well developed in no acute distress NECK: No JVD; No carotid bruits CARDIAC: RRR, no murmurs, rubs, gallops RESPIRATORY:  Clear to auscultation without rales, wheezing or rhonchi  ABDOMEN: Soft, non-tender, non-distended EXTREMITIES:  No edema; No deformity   ASSESSMENT AND PLAN: .   1.  Hypertension: He admits to eating more salt than he should.  Will increase his HCTZ to 12.5 mg a day instead of just 3 times a week.  He agrees to cut back on his salt intake.  Continue exercise.  Will have him see an APP in 6 months.   2. CAD : s/p CABG :  no angina  Cont meds.   3.  HLD :  cont current meds.   Lipids managed by primary care.            Dispo: 6 months with APP   Signed, Sean Miss, MD

## 2023-08-03 ENCOUNTER — Encounter: Payer: Self-pay | Admitting: Cardiovascular Disease

## 2023-08-03 ENCOUNTER — Ambulatory Visit: Payer: Medicare Other | Attending: Cardiovascular Disease | Admitting: Cardiovascular Disease

## 2023-08-03 VITALS — BP 160/70 | HR 78 | Ht 66.0 in | Wt 173.6 lb

## 2023-08-03 DIAGNOSIS — I10 Essential (primary) hypertension: Secondary | ICD-10-CM | POA: Diagnosis not present

## 2023-08-03 DIAGNOSIS — I25810 Atherosclerosis of coronary artery bypass graft(s) without angina pectoris: Secondary | ICD-10-CM

## 2023-08-03 MED ORDER — HYDROCHLOROTHIAZIDE 12.5 MG PO CAPS
12.5000 mg | ORAL_CAPSULE | Freq: Every day | ORAL | 3 refills | Status: DC
Start: 2023-08-03 — End: 2024-08-29

## 2023-08-03 NOTE — Patient Instructions (Signed)
Medication Instructions:  INCREASE Hydrochlorothiazide to 12.5mg  daily *If you need a refill on your cardiac medications before your next appointment, please call your pharmacy*   Lab Work: BMET in 3 weeks If you have labs (blood work) drawn today and your tests are completely normal, you will receive your results only by: MyChart Message (if you have MyChart) OR A paper copy in the mail If you have any lab test that is abnormal or we need to change your treatment, we will call you to review the results.   Testing/Procedures: NONE   Follow-Up: At [facility], you and your health needs are our priority.  As part of our continuing mission to provide you with exceptional heart care, we have created designated Provider Care Teams.  These Care Teams include your primary Cardiologist (physician) and Advanced Practice Providers (APPs -  Physician Assistants and Nurse Practitioners) who all work together to provide you with the care you need, when you need it.  We recommend signing up for the patient portal called "MyChart".  Sign up information is provided on this After Visit Summary.  MyChart is used to connect with patients for Virtual Visits (Telemedicine).  Patients are able to view lab/test results, encounter notes, upcoming appointments, etc.  Non-urgent messages can be sent to your provider as well.   To learn more about what you can do with MyChart, go to [email or web address].    Your next appointment:   6 month(s)  Provider:   Jaci Carrel, or [place]

## 2023-08-22 ENCOUNTER — Ambulatory Visit: Payer: Medicare Other | Attending: Cardiology

## 2023-08-22 DIAGNOSIS — I10 Essential (primary) hypertension: Secondary | ICD-10-CM | POA: Diagnosis not present

## 2023-08-22 DIAGNOSIS — I25810 Atherosclerosis of coronary artery bypass graft(s) without angina pectoris: Secondary | ICD-10-CM | POA: Diagnosis not present

## 2023-08-22 LAB — BASIC METABOLIC PANEL
BUN/Creatinine Ratio: 17 (ref 10–24)
BUN: 21 mg/dL (ref 10–36)
CO2: 22 mmol/L (ref 20–29)
Calcium: 9.4 mg/dL (ref 8.6–10.2)
Chloride: 102 mmol/L (ref 96–106)
Creatinine, Ser: 1.21 mg/dL (ref 0.76–1.27)
Glucose: 106 mg/dL — ABNORMAL HIGH (ref 70–99)
Potassium: 3.9 mmol/L (ref 3.5–5.2)
Sodium: 138 mmol/L (ref 134–144)
eGFR: 57 mL/min/{1.73_m2} — ABNORMAL LOW (ref >59)

## 2023-08-25 ENCOUNTER — Telehealth: Payer: Self-pay | Admitting: Cardiovascular Disease

## 2023-08-25 NOTE — Telephone Encounter (Signed)
Patient returned RN's call regarding test results.

## 2023-08-25 NOTE — Telephone Encounter (Signed)
-----   Message from Kristeen Miss sent at 08/23/2023  4:54 PM EDT ----- BMP is stable

## 2023-08-25 NOTE — Telephone Encounter (Signed)
The patient has been notified of the result and verbalized understanding.  All questions (if any) were answered. Loa Socks, LPN 4/54/0981 1:91 AM

## 2023-09-07 ENCOUNTER — Other Ambulatory Visit: Payer: Self-pay

## 2023-09-07 MED ORDER — AMLODIPINE BESYLATE 2.5 MG PO TABS: 2.5000 mg | ORAL_TABLET | Freq: Every day | ORAL | 3 refills | Status: DC

## 2023-10-07 DIAGNOSIS — H35033 Hypertensive retinopathy, bilateral: Secondary | ICD-10-CM | POA: Diagnosis not present

## 2023-10-07 DIAGNOSIS — E7800 Pure hypercholesterolemia, unspecified: Secondary | ICD-10-CM | POA: Diagnosis not present

## 2023-10-07 DIAGNOSIS — E1169 Type 2 diabetes mellitus with other specified complication: Secondary | ICD-10-CM | POA: Diagnosis not present

## 2023-10-07 DIAGNOSIS — N1831 Chronic kidney disease, stage 3a: Secondary | ICD-10-CM | POA: Diagnosis not present

## 2023-10-07 DIAGNOSIS — Z23 Encounter for immunization: Secondary | ICD-10-CM | POA: Diagnosis not present

## 2023-10-07 DIAGNOSIS — I10 Essential (primary) hypertension: Secondary | ICD-10-CM | POA: Diagnosis not present

## 2023-10-07 DIAGNOSIS — I2510 Atherosclerotic heart disease of native coronary artery without angina pectoris: Secondary | ICD-10-CM | POA: Diagnosis not present

## 2023-12-27 DIAGNOSIS — H40053 Ocular hypertension, bilateral: Secondary | ICD-10-CM | POA: Diagnosis not present

## 2024-04-10 DIAGNOSIS — Z0000 Encounter for general adult medical examination without abnormal findings: Secondary | ICD-10-CM | POA: Diagnosis not present

## 2024-04-10 DIAGNOSIS — I10 Essential (primary) hypertension: Secondary | ICD-10-CM | POA: Diagnosis not present

## 2024-04-10 DIAGNOSIS — Z1331 Encounter for screening for depression: Secondary | ICD-10-CM | POA: Diagnosis not present

## 2024-04-10 DIAGNOSIS — I2510 Atherosclerotic heart disease of native coronary artery without angina pectoris: Secondary | ICD-10-CM | POA: Diagnosis not present

## 2024-04-10 DIAGNOSIS — E7800 Pure hypercholesterolemia, unspecified: Secondary | ICD-10-CM | POA: Diagnosis not present

## 2024-04-10 DIAGNOSIS — N1831 Chronic kidney disease, stage 3a: Secondary | ICD-10-CM | POA: Diagnosis not present

## 2024-04-10 DIAGNOSIS — H35033 Hypertensive retinopathy, bilateral: Secondary | ICD-10-CM | POA: Diagnosis not present

## 2024-04-10 DIAGNOSIS — Z23 Encounter for immunization: Secondary | ICD-10-CM | POA: Diagnosis not present

## 2024-04-10 DIAGNOSIS — E1169 Type 2 diabetes mellitus with other specified complication: Secondary | ICD-10-CM | POA: Diagnosis not present

## 2024-06-18 ENCOUNTER — Other Ambulatory Visit: Payer: Self-pay

## 2024-06-18 ENCOUNTER — Emergency Department (HOSPITAL_BASED_OUTPATIENT_CLINIC_OR_DEPARTMENT_OTHER)
Admission: EM | Admit: 2024-06-18 | Discharge: 2024-06-18 | Disposition: A | Discharge status: Home / Self Care | Attending: Emergency Medicine | Admitting: Emergency Medicine

## 2024-06-18 ENCOUNTER — Encounter (HOSPITAL_BASED_OUTPATIENT_CLINIC_OR_DEPARTMENT_OTHER): Payer: Self-pay

## 2024-06-18 ENCOUNTER — Emergency Department (HOSPITAL_BASED_OUTPATIENT_CLINIC_OR_DEPARTMENT_OTHER)

## 2024-06-18 DIAGNOSIS — M25512 Pain in left shoulder: Secondary | ICD-10-CM | POA: Insufficient documentation

## 2024-06-18 DIAGNOSIS — X501XXA Overexertion from prolonged static or awkward postures, initial encounter: Secondary | ICD-10-CM | POA: Diagnosis not present

## 2024-06-18 DIAGNOSIS — Z7982 Long term (current) use of aspirin: Secondary | ICD-10-CM | POA: Diagnosis not present

## 2024-06-18 MED ORDER — DICLOFENAC SODIUM 1 % EX GEL: 4.0000 g | Freq: Four times a day (QID) | CUTANEOUS | 0 refills | Status: AC

## 2024-06-18 MED ORDER — KETOROLAC TROMETHAMINE 15 MG/ML IJ SOLN
15.0000 mg | Freq: Once | INTRAMUSCULAR | Status: AC
  Administered 2024-06-18: 15 mg via INTRAMUSCULAR
  Filled 2024-06-18: qty 1

## 2024-06-18 NOTE — ED Triage Notes (Signed)
 States he fell on the grass on 7/5 when he was chasing squirrels away from his peach tree. Fell onto hands, c/o left shoulder pain, unable to raise arm.

## 2024-06-18 NOTE — ED Notes (Signed)
 Patient transported to X-ray

## 2024-06-18 NOTE — Discharge Instructions (Signed)
 There are no obvious broken bones on your x-ray.  I have given you a sling for comfort.  As we discussed you do need to take your arm out of the sling at least 4 times a day and perform range of motion exercises.  Use the gel as prescribed. Also take tylenol  1000mg (2 extra strength) four times a day.   Please follow-up with your primary care physician in the office.

## 2024-06-18 NOTE — ED Provider Notes (Signed)
 [place] [facility] Provider Note   CSN: [id number] Arrival date & time: 06/18/24  1214     Patient presents with: Shoulder Injury   Sean Kelley is a 89 y.o. male.   89 yo M with a cc of left shoulder pain.  Patient was chasing a squirrel when he fell and landed onto outstretched arms.  Had no significant pain initially and woke up the next day and had pain especially with range of motion and trying to lift his arm.  Going on now for a couple weeks.  He has been trying to exercise a bit without much improvement.  Decided to come in for evaluation.  Denies head injury denies neck pain denies chest pain abdominal pain.   Shoulder Injury       Prior to Admission medications   Medication Sig Start Date End Date Taking? Authorizing Provider  diclofenac  Sodium (VOLTAREN ) 1 % GEL Apply 4 g topically 4 (four) times daily. 06/18/24  Yes Emil Share, DO  amLODipine  (NORVASC ) 2.5 MG tablet Take 1 tablet (2.5 mg total) by mouth daily. 09/07/23   Nahser, Aleene PARAS, MD  aspirin  EC 81 MG tablet Take 81 mg by mouth daily.    [provider]  hydrochlorothiazide  (MICROZIDE ) 12.5 MG capsule Take 1 capsule (12.5 mg total) by mouth daily. 08/03/23   Nahser, Aleene PARAS, MD  metoCLOPramide  (REGLAN ) 10 MG tablet Take 0.5 tablets (5 mg total) by mouth every 6 (six) hours as needed for nausea. Patient not taking: Reported on 08/03/2023 04/16/23   Ethyl Richerd BROCKS, MD  pantoprazole  (PROTONIX ) 20 MG tablet Take 1 tablet (20 mg total) by mouth daily. Patient not taking: Reported on 08/03/2023 04/16/23   Ethyl Richerd BROCKS, MD  simvastatin  (ZOCOR ) 20 MG tablet Take 20 mg by mouth at bedtime.    [provider]  tamsulosin (FLOMAX) 0.4 MG CAPS capsule Take 0.4 mg by mouth at bedtime.  03/21/14   [provider]    Allergies: Niacin and Penicillins    Review of Systems  Updated Vital Signs BP (!) 187/72 (BP Location: Right Arm)   Pulse 71   Temp  97.9 F (36.6 C) (Oral)   Resp 18   Ht 5' 6 (1.676 m)   Wt 80.7 kg   SpO2 98%   BMI 28.73 kg/m   Physical Exam Vitals and nursing note reviewed.  Constitutional:      Appearance: He is well-developed.  HENT:     Head: Normocephalic and atraumatic.  Eyes:     Pupils: Pupils are equal, round, and reactive to light.  Neck:     Vascular: No JVD.  Cardiovascular:     Rate and Rhythm: Normal rate and regular rhythm.     Heart sounds: No murmur heard.    No friction rub. No gallop.  Pulmonary:     Effort: No respiratory distress.     Breath sounds: No wheezing.  Abdominal:     General: There is no distension.     Tenderness: There is no abdominal tenderness. There is no guarding or rebound.  Musculoskeletal:        General: Normal range of motion.     Cervical back: Normal range of motion and neck supple.     Comments: Pain to the proximal humerus.  Pain with range of motion.  Pulse motor and sensation intact distally.  No obvious pain along the clavicle AC joint or scapula.  Skin:  Coloration: Skin is not pale.     Findings: No rash.  Neurological:     Mental Status: He is alert and oriented to person, place, and time.  Psychiatric:        Behavior: Behavior normal.     (all labs ordered are listed, but only abnormal results are displayed) Labs Reviewed - No data to display  EKG: None  Radiology: DG Shoulder Left Result Date: 06/18/2024 CLINICAL DATA:  Left shoulder pain after a fall 2 weeks ago EXAM: LEFT SHOULDER - 2+ VIEW COMPARISON:  None available FINDINGS: Visualized portion of the left hemithorax is normal. Median sternotomy is incompletely imaged. No acute fracture or dislocation. Joint spaces are maintained for age. IMPRESSION: No acute osseous abnormality. Electronically Signed   By: Rockey Kilts M.D.   On: 06/18/2024 12:57     Procedures   Medications Ordered in the ED  ketorolac  (TORADOL ) 15 MG/ML injection 15 mg (15 mg Intramuscular Given 06/18/24  1241)                                    Medical Decision Making Amount and/or Complexity of Data Reviewed Radiology: ordered.  Risk Prescription drug management.   89 yo M with a chief complaints of left shoulder pain after a fall a couple weeks ago.   will obtain a plain film.  Plain film of the left shoulder independently interpreted by me without fracture or dislocation.  Sling for comfort.  PCP follow-up.  1:02 PM:  I have discussed the diagnosis/risks/treatment options with the patient and family.  Evaluation and diagnostic testing in the emergency department does not suggest an emergent condition requiring admission or immediate intervention beyond what has been performed at this time.  They will follow up with PCP. We also discussed returning to the ED immediately if new or worsening sx occur. We discussed the sx which are most concerning (e.g., sudden worsening pain, fever, inability to tolerate by mouth) that necessitate immediate return. Medications administered to the patient during their visit and any new prescriptions provided to the patient are listed below.  Medications given during this visit Medications  ketorolac  (TORADOL ) 15 MG/ML injection 15 mg (15 mg Intramuscular Given 06/18/24 1241)     The patient appears reasonably screen and/or stabilized for discharge and I doubt any other medical condition or other [facility] requiring further screening, evaluation, or treatment in the ED at this time prior to discharge.       Final diagnoses:  None    ED Discharge Orders          Ordered    diclofenac  Sodium (VOLTAREN ) 1 % GEL  4 times daily        06/18/24 1301               Emil Share, DO 06/18/24 1302

## 2024-08-15 DIAGNOSIS — M19112 Post-traumatic osteoarthritis, left shoulder: Secondary | ICD-10-CM | POA: Diagnosis not present

## 2024-08-24 DIAGNOSIS — H3561 Retinal hemorrhage, right eye: Secondary | ICD-10-CM | POA: Diagnosis not present

## 2024-08-27 DIAGNOSIS — H35033 Hypertensive retinopathy, bilateral: Secondary | ICD-10-CM | POA: Diagnosis not present

## 2024-08-27 DIAGNOSIS — H43812 Vitreous degeneration, left eye: Secondary | ICD-10-CM | POA: Diagnosis not present

## 2024-08-27 DIAGNOSIS — Z961 Presence of intraocular lens: Secondary | ICD-10-CM | POA: Diagnosis not present

## 2024-08-27 DIAGNOSIS — H43821 Vitreomacular adhesion, right eye: Secondary | ICD-10-CM | POA: Diagnosis not present

## 2024-08-27 DIAGNOSIS — H353211 Exudative age-related macular degeneration, right eye, with active choroidal neovascularization: Secondary | ICD-10-CM | POA: Diagnosis not present

## 2024-08-29 ENCOUNTER — Other Ambulatory Visit: Payer: Self-pay | Admitting: Physician Assistant

## 2024-08-29 DIAGNOSIS — I25810 Atherosclerosis of coronary artery bypass graft(s) without angina pectoris: Secondary | ICD-10-CM

## 2024-08-29 DIAGNOSIS — I10 Essential (primary) hypertension: Secondary | ICD-10-CM

## 2024-08-30 ENCOUNTER — Other Ambulatory Visit: Payer: Self-pay | Admitting: Physician Assistant

## 2024-08-30 DIAGNOSIS — I10 Essential (primary) hypertension: Secondary | ICD-10-CM

## 2024-08-30 DIAGNOSIS — I25810 Atherosclerosis of coronary artery bypass graft(s) without angina pectoris: Secondary | ICD-10-CM

## 2024-08-30 MED ORDER — HYDROCHLOROTHIAZIDE 12.5 MG PO CAPS
12.5000 mg | ORAL_CAPSULE | Freq: Every day | ORAL | 0 refills | Status: DC
Start: 2024-08-30 — End: 2024-10-08

## 2024-09-10 ENCOUNTER — Other Ambulatory Visit: Payer: Self-pay | Admitting: Physician Assistant

## 2024-09-10 DIAGNOSIS — I25810 Atherosclerosis of coronary artery bypass graft(s) without angina pectoris: Secondary | ICD-10-CM

## 2024-09-10 DIAGNOSIS — I10 Essential (primary) hypertension: Secondary | ICD-10-CM

## 2024-09-11 ENCOUNTER — Other Ambulatory Visit: Payer: Self-pay | Admitting: Physician Assistant

## 2024-09-13 MED ORDER — AMLODIPINE BESYLATE 2.5 MG PO TABS: 2.5000 mg | ORAL_TABLET | Freq: Every day | ORAL | 0 refills | Status: DC

## 2024-09-24 DIAGNOSIS — H35033 Hypertensive retinopathy, bilateral: Secondary | ICD-10-CM | POA: Diagnosis not present

## 2024-09-24 DIAGNOSIS — H43812 Vitreous degeneration, left eye: Secondary | ICD-10-CM | POA: Diagnosis not present

## 2024-09-24 DIAGNOSIS — H353211 Exudative age-related macular degeneration, right eye, with active choroidal neovascularization: Secondary | ICD-10-CM | POA: Diagnosis not present

## 2024-09-24 DIAGNOSIS — Z961 Presence of intraocular lens: Secondary | ICD-10-CM | POA: Diagnosis not present

## 2024-09-24 DIAGNOSIS — H43821 Vitreomacular adhesion, right eye: Secondary | ICD-10-CM | POA: Diagnosis not present

## 2024-10-01 DIAGNOSIS — N1831 Chronic kidney disease, stage 3a: Secondary | ICD-10-CM | POA: Diagnosis not present

## 2024-10-01 DIAGNOSIS — Z23 Encounter for immunization: Secondary | ICD-10-CM | POA: Diagnosis not present

## 2024-10-01 DIAGNOSIS — I2510 Atherosclerotic heart disease of native coronary artery without angina pectoris: Secondary | ICD-10-CM | POA: Diagnosis not present

## 2024-10-01 DIAGNOSIS — E7800 Pure hypercholesterolemia, unspecified: Secondary | ICD-10-CM | POA: Diagnosis not present

## 2024-10-01 DIAGNOSIS — H35033 Hypertensive retinopathy, bilateral: Secondary | ICD-10-CM | POA: Diagnosis not present

## 2024-10-01 DIAGNOSIS — E1169 Type 2 diabetes mellitus with other specified complication: Secondary | ICD-10-CM | POA: Diagnosis not present

## 2024-10-01 DIAGNOSIS — I10 Essential (primary) hypertension: Secondary | ICD-10-CM | POA: Diagnosis not present

## 2024-10-05 ENCOUNTER — Other Ambulatory Visit: Payer: Self-pay | Admitting: Physician Assistant

## 2024-10-05 DIAGNOSIS — I25810 Atherosclerosis of coronary artery bypass graft(s) without angina pectoris: Secondary | ICD-10-CM

## 2024-10-05 DIAGNOSIS — I10 Essential (primary) hypertension: Secondary | ICD-10-CM

## 2024-10-30 DIAGNOSIS — Z961 Presence of intraocular lens: Secondary | ICD-10-CM | POA: Diagnosis not present

## 2024-10-30 DIAGNOSIS — H43812 Vitreous degeneration, left eye: Secondary | ICD-10-CM | POA: Diagnosis not present

## 2024-10-30 DIAGNOSIS — H35033 Hypertensive retinopathy, bilateral: Secondary | ICD-10-CM | POA: Diagnosis not present

## 2024-10-30 DIAGNOSIS — H43821 Vitreomacular adhesion, right eye: Secondary | ICD-10-CM | POA: Diagnosis not present

## 2024-10-30 DIAGNOSIS — H353211 Exudative age-related macular degeneration, right eye, with active choroidal neovascularization: Secondary | ICD-10-CM | POA: Diagnosis not present
